# Patient Record
Sex: Male | Born: 1960 | Race: White | Hispanic: No | Marital: Married | Smoking: Current some day smoker
Health system: Southern US, Community
[De-identification: ages and names within clinical notes are randomized; demographics above are authoritative.]

## PROBLEM LIST (undated history)

## (undated) DIAGNOSIS — R21 Rash and other nonspecific skin eruption: Secondary | ICD-10-CM

## (undated) DIAGNOSIS — G5620 Lesion of ulnar nerve, unspecified upper limb: Secondary | ICD-10-CM

## (undated) DIAGNOSIS — E119 Type 2 diabetes mellitus without complications: Secondary | ICD-10-CM

## (undated) DIAGNOSIS — I10 Essential (primary) hypertension: Secondary | ICD-10-CM

## (undated) DIAGNOSIS — F909 Attention-deficit hyperactivity disorder, unspecified type: Secondary | ICD-10-CM

## (undated) DIAGNOSIS — G4700 Insomnia, unspecified: Secondary | ICD-10-CM

## (undated) DIAGNOSIS — D649 Anemia, unspecified: Secondary | ICD-10-CM

## (undated) DIAGNOSIS — F1011 Alcohol abuse, in remission: Secondary | ICD-10-CM

## (undated) DIAGNOSIS — G9349 Other encephalopathy: Secondary | ICD-10-CM

## (undated) DIAGNOSIS — B999 Unspecified infectious disease: Secondary | ICD-10-CM

## (undated) DIAGNOSIS — G009 Bacterial meningitis, unspecified: Secondary | ICD-10-CM

## (undated) DIAGNOSIS — F319 Bipolar disorder, unspecified: Secondary | ICD-10-CM

## (undated) HISTORY — PX: CYST EXCISION: SHX5701

---

## 2018-09-27 DIAGNOSIS — G9349 Other encephalopathy: Secondary | ICD-10-CM

## 2018-09-27 DIAGNOSIS — B999 Unspecified infectious disease: Secondary | ICD-10-CM

## 2018-09-27 HISTORY — DX: Unspecified infectious disease: B99.9

## 2018-09-27 HISTORY — DX: Other encephalopathy: G93.49

## 2018-10-11 HISTORY — PX: CERVICAL LAMINECTOMY: SHX94

## 2018-10-22 ENCOUNTER — Encounter: Payer: Self-pay | Admitting: Occupational Therapy

## 2018-10-22 NOTE — PMR Pre-admission (Signed)
[facility] PMR Admission Coordinator Pre-Admission Assessment  Patient: Jonathan Horne is an 57 y.o., male MRN: [id number] DOB: 1961-03-08 Height: 5' 1.32" (155.8 cm) Weight: 72.1 kg  Insurance Information HMO:     PPO:      PCP:      IPA:      80/20:      OTHER:  PRIMARY: BCBS      Policy#: [id number]      Subscriber: Patient CM Name: [name]      Phone#: [phone]     Fax#: [phone] Auth for CIR provided by Larene Beach at [facility] on 11/26 with auth effective 11/26-12/10. Per Larene Beach, pt can admit 11/27 with clinical updates due do LaDester on 12/9.  Pre-Cert#: [id number]      Employer:  Benefits:  Phone #: [phone]     Name: confirmed with supervisor (ref # for call: [id number]) Eff. Date: 11/27/17     Deduct: $250 (met $157.02)      Out of Pocket Max: $600 (Met $600)      Life Max: NA CIR: 70%/30%      SNF: 70%/30%, prior review required; 60 day limit Outpatient:      Co-Pay: $20/visit in office; visit limit is 30 Home Health: 70%      Co-Pay: 30% DME: 70%     Co-Pay: 30% Providers:  SECONDARY:       Policy#:       Subscriber:  CM Name:       Phone#:      Fax#:  Pre-Cert#:       Employer:  Benefits:  Phone #:      Name:  Eff. 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 Pt was sent home with IV antibiotics and  at supervision  level without AD use. Pt returned approximately 10 days later to [facility] for headaches, hallucinations, LUE weakness and LLE proprioceptive and sensory deficits as well as severe neck pain. 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 Pt underwent the following procedure: laminectomy with exploration and/or decompression of spinal cord and/or cauda equina without facetectomy, foraminotomy or discectomy, more than 2 vertebral segments; cervical ---C1, C2 and C3 decompressive laminectomies. Pt has been placed in halo brace. Pt has progressed well with therapies with both PT and OT recommending CIR. Pt is to be admitted to CIR on 10/23/18.  Patient's medical record from [facility] has been reviewed by the rehabilitation admission coordinator and physician.     Past  Medical History  No past medical history on file.  Family History   family history is not on file.  Prior Rehab/Hospitalizations Has the patient had major surgery during 100 days prior to admission? Yes    Current Medications No current outpatient medications on file.  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 Self Care: Did the patient need help bathing, dressing, using the toilet or eating?  Independent  Indoor Mobility: Did the patient  need assistance with walking from room to room (with or without device)? Independent  Stairs: Did the patient need assistance with internal or external stairs (with or without device)? Independent  Functional Cognition: Did the patient need help planning regular tasks such as shopping or remembering to take medications? Independent  Home Assistive Devices / Equipment Home Assistive Devices/Equipment: None  Prior Device Use: Indicate devices/aids used by the patient prior to current illness, exacerbation or injury? None of the above  Prior Functional Level     Prior Functional Level Current Functional Level  Bed Mobility  Independent  Min A  Transfers  Independent  Mod A x2  Mobility - Walk/Wheelchair  Independent Mod Ax2  Mobility - Ambulation/Gait  Independent  Mod A x2  Upper Body Dressing  Independent  Mod A  Lower Body Dressing  Independent  Max A  Grooming  Independent  Min A   Eating/Drinking  Independent  Supervision following set up  Toilet Transfer  Independent  Mod A x2  Bladder Continence  Continent  requiring In and Out cath, dependent  Bowel Management   Continent  incontinent, dependent  Stair Climbing  Independent  NT  Communication  WNL  no deficits noted  Memory  WNL  no deficits noted  Cooking/Meal Prep  Independent      Housework  Independent   Money Management  Independent   Driving  Independent     Special needs/care consideration BiPAP/CPAP: no CPM: no Continuous Drip IV: no Dialysis: no        Days: no Life Vest: no Oxygen: no Special Bed: no Trach Size: no Wound Vac (area): no      Location: no Skin: Halo with pins, stage 1 ulcer/sheering injury to sacrum      Bowel mgmt: functional incontinence, last BM: 10/22/18  Bladder mgmt: urinary retention, will need In and Out Cath Diabetic mgmt: yes  Previous Home Environment Living Arrangements: Spouse/significant other, Children  Lives With: Spouse, Family, Son Available Help at Discharge:  Family, Available 24 hours/day(assist splint between son and wife during the day) Type of Home: House Home Layout: Multi-level(split level) Alternate Level Stairs-Rails: Can reach both Alternate Level Stairs-Number of Steps: 7 Home Access: Stairs to enter Entrance Stairs-Rails: None Entrance Stairs-Number of Steps: 2 Bathroom Shower/Tub: Chiropodist: Standard Bathroom Accessibility: Yes How Accessible: Accessible via walker Home Care Services: No Additional Comments: split level home, will need to [facility] up steps to get to bathroom/bedroom  Discharge Living Setting Plans for Discharge Living Setting:  Patient's home, Lives with (comment) Type of Home at Discharge: House(split level) Discharge Home Layout: Multi-level Alternate Level Stairs-Rails: Can reach both Alternate Level Stairs-Number of Steps: 7 Discharge Home Access: Stairs to enter Entrance Stairs-Rails: None Entrance Stairs-Number of Steps: 2 Discharge Bathroom Shower/Tub: Tub/shower unit Discharge Bathroom Toilet: Standard Discharge Bathroom Accessibility: Yes How Accessible: Accessible via walker Does the patient have any problems obtaining your medications?: No  Social/Family/Support Systems Patient Roles: Spouse, Parent Contact Information: Jung Yurchak: [phone]; [phone] Anticipated Caregiver: Spouse and Son will alternate shifts during the day Anticipated Caregiver's Contact Information: see above Ability/Limitations of Caregiver: Min; wife was Production assistant, radio Availability: 24/7 Discharge Plan Discussed with Primary Caregiver: Yes(with spouse and pt) Is Caregiver In Agreement with Plan?: Yes Does Caregiver/Family have Issues with Lodging/Transportation while Pt is in Rehab?: No  Goals/Additional Needs Patient/Family Goal for Rehab: PT/OT: Supervision to Min A; SLP: NA Expected length of stay: 10-14 days Cultural Considerations: NA Dietary Needs: carb modified; thin  liquids Equipment Needs: TBD Pt/Family Agrees to Admission and willing to participate: Yes Program Orientation Provided & Reviewed with Pt/Caregiver Including Roles  & Responsibilities: Yes  Barriers to Discharge: Inaccessible home environment, Home environment access/layout, IV antibiotics, Incontinence, Neurogenic Bowel & Bladder, Insurance for SNF coverage  Barriers to Discharge Comments: HALO, will need to manage stairs to enter home;   Patient Condition: I have reviewed medical records from [facility], spoken with CM, RN, and both the patient and his spouse. I discussed via phone the expectations for CIR and gathered all information needed for inpatient rehabilitation assessment.  Patient will benefit from ongoing PT and OT, can actively participate in 3 hours of therapy a day 5 days of the week, and can make measurable gains during the admission.  Patient will also benefit from the coordinated team approach during an Inpatient Acute Rehabilitation admission.  The patient will receive intensive therapy as well as Rehabilitation physician, nursing, social worker, and care management interventions.  Due to bowel management, bladder management, safety, skin/wound care, disease management, medical administration, pain management, patient education the patient requires 24 hour a day rehabilitation nursing.  The patient is currently Mod A x2 with mobility and Mod to Max A for basic ADLs.  Discharge setting and therapy post discharge at home with home health is anticipated.  Patient has agreed to participate in the Acute Inpatient Rehabilitation Program and will admit today, 10/23/18.  Preadmission Screen Completed By:  Jamse Arn, 10/23/2018 11:30 AM ______________________________________________________________________   Discussed status with Dr. Posey Pronto  on 10/23/18 at 9:00AM and received telephone approval for admission today.  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 Does the need for close, 24 hr/day  Medical supervision in concert with the patient's rehab needs make it unreasonable for this patient to be served in a less intensive setting? Yes  2. Co-Morbidities requiring supervision/potential complications: bipolar disorder, DM2, and recent admission in October for bacterial meningitis 3. Due to bladder management, safety, skin/wound care, disease management, pain management and patient education, does the patient require 24 hr/day rehab nursing? Yes 4. Does the patient require coordinated care of a physician, rehab nurse, PT (1-2 hrs/day, 5 days/week) and OT (1-2 hrs/day, 5 days/week) to address physical and functional deficits in the context of the above medical diagnosis(es)? Yes Addressing deficits in the following areas: balance, endurance, locomotion, strength, transferring, bowel/bladder control,  bathing, dressing, toileting and psychosocial support 5. Can the patient actively participate in an intensive therapy program of at least 3 hrs of therapy 5 days a week? Yes 6. The potential for patient to make measurable gains while on inpatient rehab is excellent 7. Anticipated functional outcomes upon discharge from inpatients are: min assist PT, min assist OT, n/a SLP 8. Estimated rehab length of stay to reach the above functional goals is: 16-19 days. 9. Anticipated D/C setting: Home 10. Anticipated post D/C treatments: HH therapy and Home excercise program 11. Overall Rehab/Functional Prognosis: good    RECOMMENDATIONS: This patient's condition is appropriate for continued rehabilitative care in the following setting: CIR Patient has agreed to participate in recommended program. Yes Note that insurance prior authorization may be required for reimbursement for recommended care.  Comment:  Jhonnie Garner, OT 10/23/2018 Ankit Lorie Phenix 10/23/2018

## 2018-10-23 ENCOUNTER — Inpatient Hospital Stay (HOSPITAL_COMMUNITY)
Admission: RE | Admit: 2018-10-23 | Discharge: 2018-11-06 | DRG: 559 | Disposition: A | Discharge status: Home Health | Payer: BLUE CROSS/BLUE SHIELD | Source: Intra-hospital | Attending: Physical Medicine & Rehabilitation | Admitting: Physical Medicine & Rehabilitation

## 2018-10-23 ENCOUNTER — Encounter (HOSPITAL_COMMUNITY): Payer: Self-pay | Admitting: Physical Medicine and Rehabilitation

## 2018-10-23 ENCOUNTER — Other Ambulatory Visit: Payer: Self-pay

## 2018-10-23 ENCOUNTER — Other Ambulatory Visit (HOSPITAL_COMMUNITY): Payer: Self-pay | Admitting: Physical Medicine and Rehabilitation

## 2018-10-23 ENCOUNTER — Inpatient Hospital Stay (HOSPITAL_COMMUNITY): Payer: BLUE CROSS/BLUE SHIELD

## 2018-10-23 DIAGNOSIS — Z888 Allergy status to other drugs, medicaments and biological substances status: Secondary | ICD-10-CM | POA: Diagnosis not present

## 2018-10-23 DIAGNOSIS — K5900 Constipation, unspecified: Secondary | ICD-10-CM

## 2018-10-23 DIAGNOSIS — G92 Toxic encephalopathy: Secondary | ICD-10-CM | POA: Diagnosis not present

## 2018-10-23 DIAGNOSIS — G9529 Other cord compression: Secondary | ICD-10-CM | POA: Diagnosis present

## 2018-10-23 DIAGNOSIS — Z794 Long term (current) use of insulin: Secondary | ICD-10-CM | POA: Diagnosis not present

## 2018-10-23 DIAGNOSIS — E8809 Other disorders of plasma-protein metabolism, not elsewhere classified: Secondary | ICD-10-CM | POA: Diagnosis present

## 2018-10-23 DIAGNOSIS — D649 Anemia, unspecified: Secondary | ICD-10-CM | POA: Diagnosis present

## 2018-10-23 DIAGNOSIS — G062 Extradural and subdural abscess, unspecified: Secondary | ICD-10-CM

## 2018-10-23 DIAGNOSIS — G4700 Insomnia, unspecified: Secondary | ICD-10-CM | POA: Diagnosis present

## 2018-10-23 DIAGNOSIS — K5903 Drug induced constipation: Secondary | ICD-10-CM | POA: Diagnosis not present

## 2018-10-23 DIAGNOSIS — G959 Disease of spinal cord, unspecified: Secondary | ICD-10-CM | POA: Diagnosis present

## 2018-10-23 DIAGNOSIS — Z885 Allergy status to narcotic agent status: Secondary | ICD-10-CM | POA: Diagnosis not present

## 2018-10-23 DIAGNOSIS — D62 Acute posthemorrhagic anemia: Secondary | ICD-10-CM | POA: Diagnosis present

## 2018-10-23 DIAGNOSIS — K5901 Slow transit constipation: Secondary | ICD-10-CM | POA: Diagnosis not present

## 2018-10-23 DIAGNOSIS — I10 Essential (primary) hypertension: Secondary | ICD-10-CM | POA: Diagnosis present

## 2018-10-23 DIAGNOSIS — Z8249 Family history of ischemic heart disease and other diseases of the circulatory system: Secondary | ICD-10-CM | POA: Diagnosis not present

## 2018-10-23 DIAGNOSIS — R1313 Dysphagia, pharyngeal phase: Secondary | ICD-10-CM | POA: Diagnosis present

## 2018-10-23 DIAGNOSIS — Z452 Encounter for adjustment and management of vascular access device: Secondary | ICD-10-CM

## 2018-10-23 DIAGNOSIS — F319 Bipolar disorder, unspecified: Secondary | ICD-10-CM | POA: Diagnosis present

## 2018-10-23 DIAGNOSIS — F3170 Bipolar disorder, currently in remission, most recent episode unspecified: Secondary | ICD-10-CM | POA: Diagnosis not present

## 2018-10-23 DIAGNOSIS — N319 Neuromuscular dysfunction of bladder, unspecified: Secondary | ICD-10-CM

## 2018-10-23 DIAGNOSIS — Z833 Family history of diabetes mellitus: Secondary | ICD-10-CM

## 2018-10-23 DIAGNOSIS — G9340 Encephalopathy, unspecified: Secondary | ICD-10-CM

## 2018-10-23 DIAGNOSIS — E119 Type 2 diabetes mellitus without complications: Secondary | ICD-10-CM

## 2018-10-23 DIAGNOSIS — Z87891 Personal history of nicotine dependence: Secondary | ICD-10-CM

## 2018-10-23 DIAGNOSIS — G5620 Lesion of ulnar nerve, unspecified upper limb: Secondary | ICD-10-CM | POA: Diagnosis present

## 2018-10-23 DIAGNOSIS — T40605A Adverse effect of unspecified narcotics, initial encounter: Secondary | ICD-10-CM | POA: Diagnosis not present

## 2018-10-23 DIAGNOSIS — Z4789 Encounter for other orthopedic aftercare: Secondary | ICD-10-CM | POA: Diagnosis present

## 2018-10-23 DIAGNOSIS — K592 Neurogenic bowel, not elsewhere classified: Secondary | ICD-10-CM | POA: Diagnosis present

## 2018-10-23 DIAGNOSIS — R338 Other retention of urine: Secondary | ICD-10-CM | POA: Diagnosis present

## 2018-10-23 DIAGNOSIS — Z811 Family history of alcohol abuse and dependence: Secondary | ICD-10-CM

## 2018-10-23 DIAGNOSIS — F909 Attention-deficit hyperactivity disorder, unspecified type: Secondary | ICD-10-CM | POA: Diagnosis present

## 2018-10-23 DIAGNOSIS — F419 Anxiety disorder, unspecified: Secondary | ICD-10-CM | POA: Diagnosis present

## 2018-10-23 DIAGNOSIS — I959 Hypotension, unspecified: Secondary | ICD-10-CM | POA: Diagnosis not present

## 2018-10-23 DIAGNOSIS — R609 Edema, unspecified: Secondary | ICD-10-CM | POA: Diagnosis not present

## 2018-10-23 HISTORY — DX: Alcohol abuse, in remission: F10.11

## 2018-10-23 HISTORY — DX: Rash and other nonspecific skin eruption: R21

## 2018-10-23 HISTORY — DX: Insomnia, unspecified: G47.00

## 2018-10-23 HISTORY — DX: Other encephalopathy: G93.49

## 2018-10-23 HISTORY — DX: Bacterial meningitis, unspecified: G00.9

## 2018-10-23 HISTORY — DX: Attention-deficit hyperactivity disorder, unspecified type: F90.9

## 2018-10-23 HISTORY — DX: Lesion of ulnar nerve, unspecified upper limb: G56.20

## 2018-10-23 HISTORY — DX: Essential (primary) hypertension: I10

## 2018-10-23 HISTORY — DX: Anemia, unspecified: D64.9

## 2018-10-23 HISTORY — DX: Type 2 diabetes mellitus without complications: E11.9

## 2018-10-23 HISTORY — DX: Bipolar disorder, unspecified: F31.9

## 2018-10-23 HISTORY — DX: Unspecified infectious disease: B99.9

## 2018-10-23 LAB — GLUCOSE, CAPILLARY
GLUCOSE-CAPILLARY: 164 mg/dL — AB (ref 70–99)
Glucose-Capillary: 122 mg/dL — ABNORMAL HIGH (ref 70–99)

## 2018-10-23 MED ORDER — SODIUM CHLORIDE 0.9% FLUSH
10.0000 mL | Freq: Two times a day (BID) | INTRAVENOUS | Status: DC
  Administered 2018-10-24 – 2018-11-02 (×12): 10 mL
  Administered 2018-11-05: 40 mL
  Administered 2018-11-05: 10 mL
  Administered 2018-11-05: 40 mL

## 2018-10-23 MED ORDER — DIPHENHYDRAMINE HCL 12.5 MG/5ML PO ELIX: 12.5000 mg | ORAL_SOLUTION | Freq: Four times a day (QID) | ORAL | Status: DC | PRN

## 2018-10-23 MED ORDER — ALUM & MAG HYDROXIDE-SIMETH 200-200-20 MG/5ML PO SUSP: 30.0000 mL | ORAL | Status: DC | PRN

## 2018-10-23 MED ORDER — TRAMADOL HCL 50 MG PO TABS
50.0000 mg | ORAL_TABLET | Freq: Four times a day (QID) | ORAL | Status: DC | PRN
  Filled 2018-10-23: qty 1

## 2018-10-23 MED ORDER — PROCHLORPERAZINE EDISYLATE 10 MG/2ML IJ SOLN: 5.0000 mg | Freq: Four times a day (QID) | INTRAMUSCULAR | Status: DC | PRN

## 2018-10-23 MED ORDER — HYDROCORTISONE 2.5 % RE CREA
TOPICAL_CREAM | RECTAL | Status: DC
Start: 2018-10-23 — End: 2018-10-23

## 2018-10-23 MED ORDER — BISACODYL 10 MG RE SUPP
10.0000 mg | Freq: Every day | RECTAL | Status: DC
  Administered 2018-10-24 – 2018-11-06 (×14): 10 mg via RECTAL
  Filled 2018-10-23 (×14): qty 1

## 2018-10-23 MED ORDER — INSULIN ASPART 100 UNIT/ML ~~LOC~~ SOLN
10.0000 [IU] | Freq: Two times a day (BID) | SUBCUTANEOUS | Status: DC
  Administered 2018-10-23 – 2018-11-05 (×25): 10 [IU] via SUBCUTANEOUS

## 2018-10-23 MED ORDER — SODIUM CHLORIDE 0.9% FLUSH
10.0000 mL | INTRAVENOUS | Status: DC | PRN
  Administered 2018-10-24 – 2018-10-25 (×2): 20 mL
  Administered 2018-10-25 – 2018-11-04 (×8): 10 mL
  Filled 2018-10-23 (×10): qty 40

## 2018-10-23 MED ORDER — LOSARTAN POTASSIUM 25 MG PO TABS
25.00 | ORAL_TABLET | ORAL | Status: DC
Start: 2018-10-24 — End: 2018-10-23

## 2018-10-23 MED ORDER — TAMSULOSIN HCL 0.4 MG PO CAPS
0.4000 mg | ORAL_CAPSULE | Freq: Every day | ORAL | Status: DC
  Administered 2018-10-24 – 2018-11-05 (×13): 0.4 mg via ORAL
  Filled 2018-10-23 (×13): qty 1

## 2018-10-23 MED ORDER — TAMSULOSIN HCL 0.4 MG PO CAPS
0.40 | ORAL_CAPSULE | ORAL | Status: DC
Start: 2018-10-24 — End: 2018-10-23

## 2018-10-23 MED ORDER — ARIPIPRAZOLE 5 MG PO TABS
15.0000 mg | ORAL_TABLET | Freq: Every day | ORAL | Status: DC
  Administered 2018-10-24 – 2018-11-06 (×14): 15 mg via ORAL
  Filled 2018-10-23 (×14): qty 1

## 2018-10-23 MED ORDER — SODIUM CHLORIDE FLUSH 0.9 % IV SOLN
10.00 | INTRAVENOUS | Status: DC
Start: ? — End: 2018-10-23

## 2018-10-23 MED ORDER — ACETAMINOPHEN 325 MG PO TABS
325.0000 mg | ORAL_TABLET | ORAL | Status: DC | PRN
  Administered 2018-10-23 – 2018-11-05 (×21): 650 mg via ORAL
  Administered 2018-11-05: 325 mg via ORAL
  Administered 2018-11-06: 650 mg via ORAL
  Filled 2018-10-23 (×24): qty 2

## 2018-10-23 MED ORDER — PROCHLORPERAZINE 25 MG RE SUPP: 12.5000 mg | Freq: Four times a day (QID) | RECTAL | Status: DC | PRN

## 2018-10-23 MED ORDER — PRO-STAT SUGAR FREE PO LIQD
30.0000 mL | Freq: Two times a day (BID) | ORAL | Status: DC
  Administered 2018-10-23 – 2018-10-28 (×9): 30 mL via ORAL
  Filled 2018-10-23 (×10): qty 30

## 2018-10-23 MED ORDER — GLUCAGON HCL RDNA (DIAGNOSTIC) 1 MG IJ SOLR
1.00 | INTRAMUSCULAR | Status: DC
Start: ? — End: 2018-10-23

## 2018-10-23 MED ORDER — GENERIC EXTERNAL MEDICATION
150.00 | Status: DC
Start: 2018-10-24 — End: 2018-10-23

## 2018-10-23 MED ORDER — TRAZODONE HCL 50 MG PO TABS
25.0000 mg | ORAL_TABLET | Freq: Every evening | ORAL | Status: DC | PRN
  Administered 2018-10-23 – 2018-10-29 (×3): 50 mg via ORAL
  Filled 2018-10-23 (×3): qty 1

## 2018-10-23 MED ORDER — ATORVASTATIN CALCIUM 80 MG PO TABS
80.0000 mg | ORAL_TABLET | Freq: Every day | ORAL | Status: DC
  Administered 2018-10-24 – 2018-11-05 (×13): 80 mg via ORAL
  Filled 2018-10-23 (×13): qty 1

## 2018-10-23 MED ORDER — HYDROXYZINE HCL 10 MG PO TABS
10.00 | ORAL_TABLET | ORAL | Status: DC
Start: ? — End: 2018-10-23

## 2018-10-23 MED ORDER — GENERIC EXTERNAL MEDICATION
4.00 | Status: DC
Start: ? — End: 2018-10-23

## 2018-10-23 MED ORDER — BACITRACIN-NEOMYCIN-POLYMYXIN OINTMENT TUBE
TOPICAL_OINTMENT | Freq: Two times a day (BID) | CUTANEOUS | Status: DC
  Administered 2018-10-23 – 2018-10-24 (×2): via TOPICAL
  Administered 2018-10-24: 1 via TOPICAL
  Administered 2018-10-25 – 2018-10-27 (×5): via TOPICAL
  Administered 2018-10-27: 1 via TOPICAL
  Administered 2018-10-28 – 2018-11-01 (×9): via TOPICAL
  Administered 2018-11-01 – 2018-11-02 (×2): 1 via TOPICAL
  Administered 2018-11-02 – 2018-11-06 (×8): via TOPICAL
  Filled 2018-10-23: qty 14

## 2018-10-23 MED ORDER — GUAIFENESIN-DM 100-10 MG/5ML PO SYRP: 5.0000 mL | ORAL_SOLUTION | Freq: Four times a day (QID) | ORAL | Status: DC | PRN

## 2018-10-23 MED ORDER — HYDROGEN PEROXIDE 3 % EX SOLN
Freq: Two times a day (BID) | CUTANEOUS | Status: DC
  Administered 2018-10-24 (×2): via TOPICAL
  Administered 2018-10-24: 1 via TOPICAL
  Administered 2018-10-25 – 2018-10-27 (×5): via TOPICAL
  Administered 2018-10-27: 1 via TOPICAL
  Administered 2018-10-28 – 2018-10-29 (×4): via TOPICAL
  Administered 2018-10-30: 1 via TOPICAL
  Administered 2018-10-30 – 2018-11-03 (×8): via TOPICAL
  Administered 2018-11-03: 1 via TOPICAL
  Administered 2018-11-04 – 2018-11-06 (×5): via TOPICAL
  Filled 2018-10-23 (×3): qty 473

## 2018-10-23 MED ORDER — ALPRAZOLAM 0.5 MG PO TABS
0.5000 mg | ORAL_TABLET | Freq: Every evening | ORAL | Status: DC | PRN
  Administered 2018-10-26 – 2018-11-05 (×11): 0.5 mg via ORAL
  Filled 2018-10-23 (×12): qty 1

## 2018-10-23 MED ORDER — GENERIC EXTERNAL MEDICATION
2.00 | Status: DC
Start: 2018-10-23 — End: 2018-10-23

## 2018-10-23 MED ORDER — ATORVASTATIN CALCIUM 80 MG PO TABS: 80.0000 mg | ORAL_TABLET | Freq: Every day | ORAL | Status: DC

## 2018-10-23 MED ORDER — FINASTERIDE 5 MG PO TABS
5.0000 mg | ORAL_TABLET | Freq: Every day | ORAL | Status: DC
  Administered 2018-10-24 – 2018-11-06 (×14): 5 mg via ORAL
  Filled 2018-10-23 (×14): qty 1

## 2018-10-23 MED ORDER — ACETAMINOPHEN 325 MG PO TABS
650.00 | ORAL_TABLET | ORAL | Status: DC
Start: ? — End: 2018-10-23

## 2018-10-23 MED ORDER — DOCUSATE SODIUM 100 MG PO CAPS
100.00 | ORAL_CAPSULE | ORAL | Status: DC
Start: 2018-10-24 — End: 2018-10-23

## 2018-10-23 MED ORDER — SERTRALINE HCL 100 MG PO TABS
100.0000 mg | ORAL_TABLET | Freq: Every day | ORAL | Status: DC
  Administered 2018-10-24 – 2018-11-06 (×14): 100 mg via ORAL
  Filled 2018-10-23 (×14): qty 1

## 2018-10-23 MED ORDER — HYDROCERIN EX CREA
TOPICAL_CREAM | Freq: Two times a day (BID) | CUTANEOUS | Status: DC
  Administered 2018-10-23 – 2018-10-26 (×6): via TOPICAL
  Administered 2018-10-27: 1 via TOPICAL
  Administered 2018-10-28 – 2018-11-01 (×8): via TOPICAL
  Administered 2018-11-01: 1 via TOPICAL
  Administered 2018-11-02: 09:00:00 via TOPICAL
  Administered 2018-11-02: 1 via TOPICAL
  Administered 2018-11-03: 08:00:00 via TOPICAL
  Administered 2018-11-03: 1 via TOPICAL
  Administered 2018-11-04 – 2018-11-06 (×5): via TOPICAL
  Filled 2018-10-23: qty 113

## 2018-10-23 MED ORDER — SODIUM CHLORIDE FLUSH 0.9 % IV SOLN
10.00 | INTRAVENOUS | Status: DC
Start: 2018-10-23 — End: 2018-10-23

## 2018-10-23 MED ORDER — PROCHLORPERAZINE MALEATE 5 MG PO TABS: 5.0000 mg | ORAL_TABLET | Freq: Four times a day (QID) | ORAL | Status: DC | PRN

## 2018-10-23 MED ORDER — GLUCERNA SHAKE PO LIQD
237.0000 mL | Freq: Three times a day (TID) | ORAL | Status: DC
  Administered 2018-10-23 – 2018-10-30 (×18): 237 mL via ORAL

## 2018-10-23 MED ORDER — MELATONIN 3 MG PO TABS
3.00 | ORAL_TABLET | ORAL | Status: DC
Start: 2018-10-23 — End: 2018-10-23

## 2018-10-23 MED ORDER — LIDOCAINE HCL URETHRAL/MUCOSAL 2 % EX GEL
CUTANEOUS | Status: DC | PRN
  Filled 2018-10-23 (×3): qty 5

## 2018-10-23 MED ORDER — LOSARTAN POTASSIUM 50 MG PO TABS
25.0000 mg | ORAL_TABLET | Freq: Every day | ORAL | Status: DC
  Administered 2018-10-24 – 2018-10-27 (×4): 25 mg via ORAL
  Filled 2018-10-23 (×4): qty 1

## 2018-10-23 MED ORDER — INSULIN GLARGINE 100 UNIT/ML ~~LOC~~ SOLN
28.00 | SUBCUTANEOUS | Status: DC
Start: 2018-10-23 — End: 2018-10-23

## 2018-10-23 MED ORDER — HYDROXYZINE HCL 10 MG PO TABS
10.0000 mg | ORAL_TABLET | Freq: Four times a day (QID) | ORAL | Status: DC | PRN
  Administered 2018-10-25 – 2018-10-30 (×2): 10 mg via ORAL
  Filled 2018-10-23 (×3): qty 1

## 2018-10-23 MED ORDER — SIMETHICONE 80 MG PO CHEW
80.00 | CHEWABLE_TABLET | ORAL | Status: DC
Start: ? — End: 2018-10-23

## 2018-10-23 MED ORDER — DERMACERIN EX CREA
TOPICAL_CREAM | CUTANEOUS | Status: DC
Start: ? — End: 2018-10-23

## 2018-10-23 MED ORDER — INSULIN ASPART 100 UNIT/ML ~~LOC~~ SOLN
0.0000 [IU] | Freq: Every day | SUBCUTANEOUS | Status: DC
  Administered 2018-11-02: 2 [IU] via SUBCUTANEOUS

## 2018-10-23 MED ORDER — INSULIN ASPART 100 UNIT/ML ~~LOC~~ SOLN
8.0000 [IU] | Freq: Every day | SUBCUTANEOUS | Status: DC
  Administered 2018-10-24 – 2018-11-05 (×13): 8 [IU] via SUBCUTANEOUS

## 2018-10-23 MED ORDER — ALPRAZOLAM 0.5 MG PO TABS
0.50 | ORAL_TABLET | ORAL | Status: DC
Start: ? — End: 2018-10-23

## 2018-10-23 MED ORDER — FINASTERIDE 5 MG PO TABS
5.00 | ORAL_TABLET | ORAL | Status: DC
Start: 2018-10-24 — End: 2018-10-23

## 2018-10-23 MED ORDER — SENNOSIDES-DOCUSATE SODIUM 8.6-50 MG PO TABS: 2.0000 | ORAL_TABLET | Freq: Every evening | ORAL | Status: DC | PRN

## 2018-10-23 MED ORDER — ENOXAPARIN SODIUM 40 MG/0.4ML ~~LOC~~ SOLN
40.0000 mg | Freq: Every day | SUBCUTANEOUS | Status: DC
  Administered 2018-10-24 – 2018-11-06 (×14): 40 mg via SUBCUTANEOUS
  Filled 2018-10-23 (×14): qty 0.4

## 2018-10-23 MED ORDER — INSULIN LISPRO 100 UNIT/ML ~~LOC~~ SOLN
0.00 | SUBCUTANEOUS | Status: DC
Start: 2018-10-23 — End: 2018-10-23

## 2018-10-23 MED ORDER — INSULIN LISPRO 100 UNIT/ML ~~LOC~~ SOLN
8.00 | SUBCUTANEOUS | Status: DC
Start: 2018-10-24 — End: 2018-10-23

## 2018-10-23 MED ORDER — ASPIRIN EC 81 MG PO TBEC
81.00 | DELAYED_RELEASE_TABLET | ORAL | Status: DC
Start: 2018-10-24 — End: 2018-10-23

## 2018-10-23 MED ORDER — INSULIN GLARGINE 100 UNIT/ML ~~LOC~~ SOLN
28.0000 [IU] | Freq: Every day | SUBCUTANEOUS | Status: DC
  Administered 2018-10-23 – 2018-11-05 (×14): 28 [IU] via SUBCUTANEOUS
  Filled 2018-10-23 (×19): qty 0.28

## 2018-10-23 MED ORDER — HYDROCORTISONE ACETATE 25 MG RE SUPP
25.0000 mg | Freq: Two times a day (BID) | RECTAL | Status: DC
  Administered 2018-10-23 – 2018-11-01 (×6): 25 mg via RECTAL
  Filled 2018-10-23 (×20): qty 1

## 2018-10-23 MED ORDER — ATORVASTATIN CALCIUM 40 MG PO TABS
80.00 | ORAL_TABLET | ORAL | Status: DC
Start: 2018-10-24 — End: 2018-10-23

## 2018-10-23 MED ORDER — SODIUM CHLORIDE 0.9 % IV SOLN
2.0000 g | Freq: Two times a day (BID) | INTRAVENOUS | Status: DC
  Administered 2018-10-23 – 2018-11-06 (×29): 2 g via INTRAVENOUS
  Filled 2018-10-23 (×31): qty 20

## 2018-10-23 MED ORDER — INSULIN LISPRO 100 UNIT/ML ~~LOC~~ SOLN
10.00 | SUBCUTANEOUS | Status: DC
Start: 2018-10-23 — End: 2018-10-23

## 2018-10-23 MED ORDER — DEXTROSE 50 % IV SOLN
12.50 | INTRAVENOUS | Status: DC
Start: ? — End: 2018-10-23

## 2018-10-23 MED ORDER — TAMSULOSIN HCL 0.4 MG PO CAPS: 0.4000 mg | ORAL_CAPSULE | Freq: Every day | ORAL | Status: DC

## 2018-10-23 MED ORDER — NYSTATIN 100000 UNIT/GM EX CREA
TOPICAL_CREAM | Freq: Two times a day (BID) | CUTANEOUS | Status: DC
  Administered 2018-10-23 – 2018-10-30 (×12): via TOPICAL
  Administered 2018-10-30: 1 via TOPICAL
  Administered 2018-10-31 – 2018-11-01 (×3): via TOPICAL
  Administered 2018-11-01: 1 via TOPICAL
  Administered 2018-11-02: 09:00:00 via TOPICAL
  Administered 2018-11-02 – 2018-11-03 (×2): 1 via TOPICAL
  Administered 2018-11-03 – 2018-11-06 (×5): via TOPICAL
  Filled 2018-10-23 (×3): qty 15

## 2018-10-23 MED ORDER — FLEET ENEMA 7-19 GM/118ML RE ENEM: 1.0000 | ENEMA | Freq: Once | RECTAL | Status: DC | PRN

## 2018-10-23 MED ORDER — BACITRACIN-POLYMYXIN B 500-10000 UNIT/GM EX OINT
TOPICAL_OINTMENT | CUTANEOUS | Status: DC
Start: 2018-10-23 — End: 2018-10-23

## 2018-10-23 MED ORDER — HEPARIN SODIUM (PORCINE) 10000 UNIT/ML IJ SOLN
5000.00 | INTRAMUSCULAR | Status: DC
Start: 2018-10-23 — End: 2018-10-23

## 2018-10-23 MED ORDER — INSULIN ASPART 100 UNIT/ML ~~LOC~~ SOLN
0.0000 [IU] | Freq: Three times a day (TID) | SUBCUTANEOUS | Status: DC
  Administered 2018-10-23 – 2018-10-24 (×2): 3 [IU] via SUBCUTANEOUS
  Administered 2018-10-24: 2 [IU] via SUBCUTANEOUS
  Administered 2018-10-24: 8 [IU] via SUBCUTANEOUS
  Administered 2018-10-25 – 2018-10-26 (×6): 3 [IU] via SUBCUTANEOUS
  Administered 2018-10-27: 2 [IU] via SUBCUTANEOUS
  Administered 2018-10-27 (×2): 3 [IU] via SUBCUTANEOUS
  Administered 2018-10-28 – 2018-10-30 (×6): 2 [IU] via SUBCUTANEOUS
  Administered 2018-10-31 – 2018-11-01 (×3): 3 [IU] via SUBCUTANEOUS
  Administered 2018-11-01: 5 [IU] via SUBCUTANEOUS
  Administered 2018-11-02: 3 [IU] via SUBCUTANEOUS
  Administered 2018-11-03: 2 [IU] via SUBCUTANEOUS
  Administered 2018-11-03 – 2018-11-04 (×3): 3 [IU] via SUBCUTANEOUS
  Administered 2018-11-04 – 2018-11-05 (×3): 2 [IU] via SUBCUTANEOUS
  Administered 2018-11-05: 3 [IU] via SUBCUTANEOUS

## 2018-10-23 MED ORDER — ARIPIPRAZOLE 15 MG PO TABS
15.00 | ORAL_TABLET | ORAL | Status: DC
Start: 2018-10-24 — End: 2018-10-23

## 2018-10-23 NOTE — Progress Notes (Signed)
[facility] PMR Admission Coordinator Pre-Admission Assessment  Patient: Jonathan Horne is an 57 y.o., male MRN: [id number] DOB: February 24, 1961 Height: 5' 1.32" (155.8 cm) Weight: 72.1 kg  Insurance Information HMO:     PPO:      PCP:      IPA:      80/20:      OTHER:  PRIMARY: BCBS      Policy#: [id number]      Subscriber: Patient CM Name: [name]      Phone#: [phone]     Fax#: [phone] Auth for CIR provided by Larene Beach at [facility] on 11/26 with auth effective 11/26-12/10. Per Larene Beach, pt can admit 11/27 with clinical updates due do LaDester on 12/9.  Pre-Cert#: [id number]      Employer:  Benefits:  Phone #: [phone]     Name: confirmed with supervisor (ref # for call: [id number]) Eff. Date: 11/27/17     Deduct: $250 (met $157.02)      Out of Pocket Max: $600 (Met $600)      Life Max: NA CIR: 70%/30%      SNF: 70%/30%, prior review required; 60 day limit Outpatient:      Co-Pay: $20/visit in office; visit limit is 30 Home Health: 70%      Co-Pay: 30% DME: 70%     Co-Pay: 30% Providers:  SECONDARY:       Policy#:       Subscriber:  CM Name:       Phone#:      Fax#:  Pre-Cert#:       Employer:  Benefits:  Phone #:      Name:  Eff. 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 Pt was sent home with IV  antibiotics and  at supervision level without AD use. Pt returned approximately 10 days later to [facility] for headaches, hallucinations, LUE weakness and LLE proprioceptive and sensory deficits as well as severe neck pain. 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 Pt underwent the following procedure: laminectomy with exploration and/or decompression of spinal cord and/or cauda equina without facetectomy, foraminotomy or discectomy, more than 2 vertebral segments; cervical ---C1, C2 and C3 decompressive laminectomies. Pt has been placed in halo brace. Pt has progressed well with therapies with both PT and OT recommending CIR. Pt is to be admitted to CIR on 10/23/18.  Patient's medical record from [facility] has been reviewed by the rehabilitation admission  coordinator and physician.   Past Medical History  No past medical history on file.  Family History   family history is not on file.  Prior Rehab/Hospitalizations Has the patient had major surgery during 100 days prior to admission? Yes              Current Medications No current outpatient medications on file.  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 Self Care: Did the patient need help bathing, dressing, using the toilet or  eating?  Independent  Indoor Mobility: Did the patient need assistance with walking from room to room (with or without device)? Independent  Stairs: Did the patient need assistance with internal or external stairs (with or without device)? Independent  Functional Cognition: Did the patient need help planning regular tasks such as shopping or remembering to take medications? Independent  Home Assistive Devices / Equipment Home Assistive Devices/Equipment: None  Prior Device Use: Indicate devices/aids used by the patient prior to current illness, exacerbation or injury? None of the above  Prior Functional Level   Prior Functional Level Current Functional Level  Bed Mobility  Independent  Min A  Transfers  Independent  Mod A x2  Mobility - Walk/Wheelchair  Independent Mod Ax2  Mobility - Ambulation/Gait  Independent  Mod A x2  Upper Body Dressing  Independent  Mod A  Lower Body Dressing  Independent  Max A  Grooming  Independent  Min A   Eating/Drinking  Independent  Supervision following set up  Toilet Transfer  Independent  Mod A x2  Bladder Continence  Continent  requiring In and Out cath, dependent  Bowel Management   Continent  incontinent, dependent  Stair Climbing  Independent  NT  Communication  WNL  no deficits noted  Memory  WNL  no deficits noted  Cooking/Meal Prep  Independent      Housework  Independent   Money Management  Independent   Driving  Independent     Special needs/care consideration BiPAP/CPAP: no CPM: no Continuous Drip IV: no Dialysis: no        Days: no Life Vest: no Oxygen: no Special Bed: no Trach Size: no Wound Vac (area): no      Location: no Skin: Halo with pins, stage 1 ulcer/sheering injury to sacrum      Bowel mgmt: functional incontinence, last BM: 10/22/18  Bladder mgmt: urinary retention, will need In and Out Cath Diabetic mgmt: yes  Previous Home Environment Living Arrangements: Spouse/significant  other, Children  Lives With: Spouse, Family, Son Available Help at Discharge: Family, Available 24 hours/day(assist splint between son and wife during the day) Type of Home: House Home Layout: Multi-level(split level) Alternate Level Stairs-Rails: Can reach both Alternate Level Stairs-Number of Steps: 7 Home Access: Stairs to enter Entrance Stairs-Rails: None Entrance Stairs-Number of Steps: 2 Bathroom Shower/Tub: Chiropodist: Standard Bathroom Accessibility: Yes How Accessible: Accessible via walker Home Care Services: No Additional Comments: split level home, will need  to [facility] up steps to get to bathroom/bedroom  Discharge Living Setting Plans for Discharge Living Setting: Patient's home, Lives with (comment) Type of Home at Discharge: House(split level) Discharge Home Layout: Multi-level Alternate Level Stairs-Rails: Can reach both Alternate Level Stairs-Number of Steps: 7 Discharge Home Access: Stairs to enter Entrance Stairs-Rails: None Entrance Stairs-Number of Steps: 2 Discharge Bathroom Shower/Tub: Tub/shower unit Discharge Bathroom Toilet: Standard Discharge Bathroom Accessibility: Yes How Accessible: Accessible via walker Does the patient have any problems obtaining your medications?: No  Social/Family/Support Systems Patient Roles: Spouse, Parent Contact Information: Tanner Yeley: [phone]; [phone] Anticipated Caregiver: Spouse and Son will alternate shifts during the day Anticipated Caregiver's Contact Information: see above Ability/Limitations of Caregiver: Min; wife was Production assistant, radio Availability: 24/7 Discharge Plan Discussed with Primary Caregiver: Yes(with spouse and pt) Is Caregiver In Agreement with Plan?: Yes Does Caregiver/Family have Issues with Lodging/Transportation while Pt is in Rehab?: No  Goals/Additional Needs Patient/Family Goal for Rehab: PT/OT: Supervision to Min A; SLP: NA Expected length of stay:  10-14 days Cultural Considerations: NA Dietary Needs: carb modified; thin liquids Equipment Needs: TBD Pt/Family Agrees to Admission and willing to participate: Yes Program Orientation Provided & Reviewed with Pt/Caregiver Including Roles  & Responsibilities: Yes  Barriers to Discharge: Inaccessible home environment, Home environment access/layout, IV antibiotics, Incontinence, Neurogenic Bowel & Bladder, Insurance for SNF coverage  Barriers to Discharge Comments: HALO, will need to manage stairs to enter home;   Patient Condition: I have reviewed medical records from [facility], spoken with CM, RN, and both the patient and his spouse. I discussed via phone the expectations for CIR and gathered all information needed for inpatient rehabilitation assessment.  Patient will benefit from ongoing PT and OT, can actively participate in 3 hours of therapy a day 5 days of the week, and can make measurable gains during the admission.  Patient will also benefit from the coordinated team approach during an Inpatient Acute Rehabilitation admission.  The patient will receive intensive therapy as well as Rehabilitation physician, nursing, social worker, and care management interventions.  Due to bowel management, bladder management, safety, skin/wound care, disease management, medical administration, pain management, patient education the patient requires 24 hour a day rehabilitation nursing.  The patient is currently Mod A x2 with mobility and Mod to Max A for basic ADLs.  Discharge setting and therapy post discharge at home with home health is anticipated.  Patient has agreed to participate in the Acute Inpatient Rehabilitation Program and will admit today, 10/23/18.  Preadmission Screen Completed By:  Jamse Arn, 10/23/2018 11:30 AM ______________________________________________________________________   Discussed status with Dr. Posey Pronto  on 10/23/18 at 9:00AM and received telephone approval for  admission today.  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 Does the need for close, 24 hr/day  Medical supervision in concert with the patient's rehab needs make it unreasonable for this patient to be served in a less intensive setting? Yes  2. Co-Morbidities requiring supervision/potential complications: bipolar disorder, DM2, and recent admission in October for bacterial meningitis 3. Due to bladder management, safety, skin/wound care, disease management, pain management and patient education, does the patient require 24 hr/day rehab nursing? Yes 4. Does the patient require coordinated care of a physician, rehab nurse, PT (1-2 hrs/day, 5 days/week) and OT (1-2 hrs/day, 5 days/week) to address physical and functional deficits in the context of the  above medical diagnosis(es)? Yes Addressing deficits in the following areas: balance, endurance, locomotion, strength, transferring, bowel/bladder control, bathing, dressing, toileting and psychosocial support 5. Can the patient actively participate in an intensive therapy program of at least 3 hrs of therapy 5 days a week? Yes 6. The potential for patient to make measurable gains while on inpatient rehab is excellent 7. Anticipated functional outcomes upon discharge from inpatients are: min assist PT, min assist OT, n/a SLP 8. Estimated rehab length of stay to reach the above functional goals is: 16-19 days. 9. Anticipated D/C setting: Home 10. Anticipated post D/C treatments: HH therapy and Home excercise program 11. Overall Rehab/Functional Prognosis: good    RECOMMENDATIONS: This patient's condition is appropriate for continued rehabilitative care in the following setting: CIR Patient has agreed to participate in recommended program. Yes Note that insurance prior  authorization may be required for reimbursement for recommended care.  Comment:  Jhonnie Garner, OT 10/23/2018 Ankit Lorie Phenix 10/23/2018        Cosigned by: Jamse Arn, MD at 10/23/2018 11:32 AM  Revision History

## 2018-10-23 NOTE — H&P (Signed)
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  History taken from chart review and patient.  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  He was discharged to home with 3 weeks course antibiotic but continued to have severe neck pain progressing LUE numbness and weakness as well as LLE numbness and to hallucinations.  He was admitted to [facility] on 10/07/2018 for work-up.  He was noted to be tachycardic and treated with fluid bolus and x-rays done revealing progression of epidural abscess with compression of upper cervical cord with increased signal at atlantal occipital articulation with suspicion of instability, soft tissue thickening can concerning for inflammation C1-C2 with phlegmon and concerns for developing abscess.  He was taken to the OR for C1-C3 laminectomy and decompression of spinal cord on 10/11/18 by Dr. Laury Deep. Halo placed for stabilization and to remain in place with pin care bid. Blood cultures X 2 11/14 were negative.  Hospital course significant for encephalopathy with hallucinations.  Tramadol and Adderall were discontinued.  Home dose Xanax decrease to bedtime due to daytime sedation.  He is also had issues with urinary retention requiring in and out caths as well as constipation. He is to continue IV ceftriaxone through 11/19/2018.  He has had improvement in LUE strength and coordination but continues to have bowel incontinence with urinary retention and significant LLE weakness.  Please also see preadmission assessment.   Review of Systems  Constitutional: Negative for chills and fever.  HENT: Negative for hearing loss and  tinnitus.   Eyes: Negative for blurred vision and double vision.  Respiratory: Negative for cough and shortness of breath.   Cardiovascular: Negative for chest pain and palpitations.  Gastrointestinal: Negative for blood in stool, constipation, heartburn and nausea.  Genitourinary: Negative for dysuria and urgency.  Musculoskeletal: Positive for neck pain. Negative for myalgias.  Skin: Positive for rash (facial--chronic ).  Neurological: Positive for sensory change, focal weakness, weakness and headaches. Negative for dizziness and speech change.  Psychiatric/Behavioral: Positive for depression (under control?). The patient has insomnia (chronic--worse).        Has manic episodes  couple of times a month.    All other systems reviewed and are negative.    Past Medical History:  Diagnosis Date  . ADHD   . Bipolar 1 disorder (HCC)   . Chronic anemia   . Diabetes mellitus (HCC)   . Encephalopathy due to infection 09/2018  . History of alcohol abuse   . HTN (hypertension)   . Insomnia   . Meningitis due to bacteria   . Rash    chronic  . Ulnar neuropathy     Past Surgical History:  Procedure Laterality Date  . CERVICAL LAMINECTOMY  10/11/2018   C1- C3 posterior laminectomy with decompression of spinal cord  . CYST EXCISION     from left hip.      Family History  Problem Relation Age of Onset  . Coronary artery disease Father   . Diabetes Father   . Alcohol abuse Brother   . Asperger's syndrome Son   . Asperger's syndrome Daughter      Social History: Married. 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 Lives with wife, 21 year old son and his mother. He was independent PTA. He quit smoking cigarettes in 1980 --he smoked for 18  Has history of alcohol abuse--has been sober since 08/25/13.  He did not use any illicit drugs 1981.    Allergies  Allergen Reactions  . Lisinopril     cough  . Reglan [Metoclopramide]     Tremors   . Tramadol  Other (See Comments)    hallucinations     Medications Prior to Admission  Medication Sig Dispense Refill  . acetaminophen (TYLENOL) 325 MG tablet Take 650 mg by mouth every 6 (six) hours as needed for pain.    . ADDERALL XR 25 MG 24 hr capsule Take 25 mg by mouth daily as needed. Boost of focus  0  . ALPRAZolam (XANAX) 0.5 MG tablet Take 0.5 mg by mouth at bedtime as needed for anxiety or sleep.    . ARIPiprazole (ABILIFY) 15 MG tablet Take 15 mg by mouth daily.  3  . ASPIRIN LOW DOSE 81 MG EC tablet Take 81 mg by mouth daily.  0  . atorvastatin (LIPITOR) 80 MG tablet Take 80 mg by mouth daily.  1  . bacitracin-polymyxin b (POLYSPORIN) ointment Apply 1 application topically 2 (two) times daily. Apply around halo pins    . [START ON 10/24/2018] docusate sodium (COLACE) 100 MG capsule Take 100 mg by mouth daily.    Melene Muller ON 10/24/2018] finasteride (PROSCAR) 5 MG tablet Take 5 mg by mouth daily.    . insulin lispro (HUMALOG) 100 UNIT/ML injection Inject 8 Units into the skin daily with breakfast.    . LANTUS 100 UNIT/ML injection Inject 30 Units into the skin at bedtime.  0  . losartan (COZAAR) 25 MG tablet Take 25 mg by mouth daily.  1  . Melatonin 3 MG TABS Take 3 mg by mouth at bedtime.    . metFORMIN (GLUCOPHAGE) 1000 MG tablet Take 1,000 mg by mouth 2 (two) times daily.  3  . NOVOLOG 100 UNIT/ML injection Inject 10 Units into the skin 3 (three) times daily with meals.  0  . oxyCODONE (OXY IR/ROXICODONE) 5 MG immediate release tablet Take 5 mg by mouth every 6 (six) hours as needed for moderate pain.   0  . sertraline (ZOLOFT) 100 MG tablet Take 150 mg by mouth daily.  3  . tamsulosin (FLOMAX) 0.4 MG CAPS capsule Take 0.4 mg by mouth daily.  1    Drug Regimen Review  Drug regimen was reviewed and remains appropriate with no significant issues identified  Home: Split level home with 2 STE and 5-7 steps inside.    Functional History: Independent prior to Oct admission.    Functional Status:  Mobility: Mod assist +2  with STEDY to stand.        ADL: Min assist for grooming Mod assist UB dressing Max assist LB dressing.  Mod assist toilet transfer.   Cognition:      Physical Exam: Blood pressure 113/80, pulse 94, temperature 98.3 F (36.8 C), resp. rate 20, height 5' 10.5" (1.791 m), weight 74 kg, SpO2 97 %. Physical Exam  Nursing note and vitals reviewed. Constitutional: He is oriented to person, place, and time. He appears well-developed and well-nourished.  HENT:  +Halo  Eyes: EOM are normal. Right eye exhibits no discharge. Left eye exhibits no discharge.  Neck:  Halo in place  Cardiovascular: Normal rate and regular rhythm.  Respiratory: Effort normal and  breath sounds normal.  GI: Soft. Bowel sounds are normal.  Musculoskeletal:  No edema or tenderness in extremities  Neurological: He is alert and oriented to person, place, and time.  Motor: Right upper extreme: 4+/5 proximal distal Right lower extremity: 4+/5 proximal distal Left upper extremity: 4-4+/5 proximal distal Left lower extremity: 4/5 proximal distal Sensation diminished to light touch along left upper extremity ulnar distribution (baseline)  Skin: Skin is warm and dry.  Facial rash along hairline  Psychiatric: He has a normal mood and affect. His behavior is normal. Thought content normal.    Recent Labs:  BMET 11/27:  Na- 140    K- 4.3   CL- 103    CO2- 30  BUN- 18  SCr- 0.6  Glucose 178       Mg- 1.7     CBC 11/14 :  WBC- 6.2   Hgb- 9.4  Hct - 30.4   Plt- 473   CRP 11/18:   6.97   Dg Abd Portable 1v  Result Date: 10/23/2018 CLINICAL DATA:  Constipation EXAM: PORTABLE ABDOMEN - 1 VIEW COMPARISON:  None. FINDINGS: Nonobstructed bowel gas pattern with large amount of stool in the colon. No radiopaque calculi. Probable phleboliths in the pelvis. IMPRESSION: Nonobstructed gas pattern with large volume of stool in the colon Electronically Signed   By: Jasmine Pang  M.D.   On: 10/23/2018 15:07       Medical Problem List and Plan: 1.  Left-sided weakness in coronation deficits,, bowel incontinence, urinary retention secondary to cervical myelopathy status post decompression and stabilization with halo.  2.  DVT Prophylaxis/Anticoagulation: Pharmaceutical: Lovenox 3. Pain Management: Tylenol prn--narcotics have been held due to encephalopathy with hallucinations.  4. Mood: LCSW to follow for evaluation and support.  5. Neuropsych: This patient is capable of making decisions on his own behalf. 6. Skin/Wound Care: Pin care bid. Monitor incision daily for healing. Maintain adequate nutritional and hydration status.  7. Fluids/Electrolytes/Nutrition: Monitor I/O. Check lytes in am.  8. Epidural abscess: Continue Ceftriaxone 2 grams every 12 hours with end date 12/24. Weekly CBC/BMET/CRP/Sed rate 9. Urinary retention/Neurogenic bladder: Continue proscar and flomax. Monitor voiding every 4-6 hours and cath to keep volumes < 350 cc. May need to add urecholine 10. Constipation/Neurogenic bowel:  Had results with dig stim this am. KUB reviewed with significant stool burden.  Will schedule suppository daily in am. 11. Bipolar disorder: has not been taking care of himself for the past few year--started being compliant since mid summer. Team to provide ego support. Continue Zoloft and abilify daily. Atrax prn for anxiety. Limit xanax to bedtime to help with sleep.  12. ABLA: Will recheck CBC in am. 13. T2DM: Was not taking lantus PTA. Continue Lantus daily with novolog 8 units breakfast and 10 units bid. Will monitor BS ac/hs and use SSI for tighter control.      Post Admission Physician Evaluation: 1. Preadmission assessment reviewed and changes made below. 2. Functional deficits secondary  to cervical myelopathy status post decompression and stabilization with halo. 3. Patient is admitted to receive collaborative, interdisciplinary care between the physiatrist,  rehab nursing staff, and therapy team. 4. Patient's level of medical complexity and substantial therapy needs in context of that medical necessity cannot be provided at a lesser intensity of care such as a SNF. 5. Patient has experienced substantial functional loss from his/her baseline which was documented above under the "Functional History" and "Functional Status" headings.  Judging by the patient's diagnosis, physical exam, and  functional history, the patient has potential for functional progress which will result in measurable gains while on inpatient rehab.  These gains will be of substantial and practical use upon discharge  in facilitating mobility and self-care at the household level. 6. Physiatrist will provide 24 hour management of medical needs as well as oversight of the therapy plan/treatment and provide guidance as appropriate regarding the interaction of the two. 7. 24 hour rehab nursing will assist with bladder management, bowel management, safety, skin/wound care, disease management, medication administration, pain management and patient education  and help integrate therapy concepts, techniques,education, etc. 8. PT will assess and treat for/with: Lower extremity strength, range of motion, stamina, balance, functional mobility, safety, adaptive techniques and equipment, wound care, coping skills, pain control, education. Goals are: Supervision/min A. 9. OT will assess and treat for/with: ADL's, functional mobility, safety, upper extremity strength, adaptive techniques and equipment, wound mgt, ego support, and community reintegration.   Goals are: Supervision/min A. Therapy may not proceed with showering this patient. 10. SLP will assess and treat for/with: Cognition.  Goals are: Supervision/mod I. 11. Case Management and Social Worker will assess and treat for psychological issues and discharge planning. 12. Team conference will be held weekly to assess progress toward goals and to  determine barriers to discharge. 13. Patient will receive at least 3 hours of therapy per day at least 5 days per week. 14. ELOS: 13- 16 days.       15. Prognosis:  excellent and good  I have personally performed a face to face diagnostic evaluation, including, but not limited to relevant history and physical exam findings, of this patient and developed relevant assessment and plan.  Additionally, I have reviewed and concur with the physician assistant's documentation above.  Maryla MorrowAnkit Patel, MD, ABPMR Jacquelynn CreePamela S Love, PA-C 10/23/2018

## 2018-10-23 NOTE — Progress Notes (Signed)
[facility] ambulance from [facility] Patient denies pain/discomfort at this time.

## 2018-10-24 ENCOUNTER — Encounter (HOSPITAL_COMMUNITY): Payer: BLUE CROSS/BLUE SHIELD

## 2018-10-24 DIAGNOSIS — K5900 Constipation, unspecified: Secondary | ICD-10-CM

## 2018-10-24 LAB — COMPREHENSIVE METABOLIC PANEL
ALT: 47 U/L — AB (ref 0–44)
AST: 27 U/L (ref 15–41)
Albumin: 2.3 g/dL — ABNORMAL LOW (ref 3.5–5.0)
Alkaline Phosphatase: 76 U/L (ref 38–126)
Anion gap: 8 (ref 5–15)
BUN: 13 mg/dL (ref 6–20)
CO2: 27 mmol/L (ref 22–32)
CREATININE: 0.71 mg/dL (ref 0.61–1.24)
Calcium: 8.3 mg/dL — ABNORMAL LOW (ref 8.9–10.3)
Chloride: 102 mmol/L (ref 98–111)
Glucose, Bld: 178 mg/dL — ABNORMAL HIGH (ref 70–99)
Potassium: 3.8 mmol/L (ref 3.5–5.1)
Sodium: 137 mmol/L (ref 135–145)
TOTAL PROTEIN: 6.3 g/dL — AB (ref 6.5–8.1)
Total Bilirubin: 0.3 mg/dL (ref 0.3–1.2)

## 2018-10-24 LAB — CBC WITH DIFFERENTIAL/PLATELET
ABS IMMATURE GRANULOCYTES: 0.05 10*3/uL (ref 0.00–0.07)
BASOS PCT: 1 %
Basophils Absolute: 0 10*3/uL (ref 0.0–0.1)
EOS ABS: 0.1 10*3/uL (ref 0.0–0.5)
Eosinophils Relative: 2 %
HEMATOCRIT: 31.8 % — AB (ref 39.0–52.0)
Hemoglobin: 9.4 g/dL — ABNORMAL LOW (ref 13.0–17.0)
IMMATURE GRANULOCYTES: 1 %
LYMPHS ABS: 1.5 10*3/uL (ref 0.7–4.0)
Lymphocytes Relative: 25 %
MCH: 25.3 pg — ABNORMAL LOW (ref 26.0–34.0)
MCHC: 29.6 g/dL — ABNORMAL LOW (ref 30.0–36.0)
MCV: 85.5 fL (ref 80.0–100.0)
MONOS PCT: 8 %
Monocytes Absolute: 0.5 10*3/uL (ref 0.1–1.0)
NEUTROS PCT: 63 %
Neutro Abs: 3.9 10*3/uL (ref 1.7–7.7)
PLATELETS: 488 10*3/uL — AB (ref 150–400)
RBC: 3.72 MIL/uL — ABNORMAL LOW (ref 4.22–5.81)
RDW: 19.8 % — AB (ref 11.5–15.5)
WBC: 6.2 10*3/uL (ref 4.0–10.5)
nRBC: 0 % (ref 0.0–0.2)

## 2018-10-24 LAB — GLUCOSE, CAPILLARY
GLUCOSE-CAPILLARY: 122 mg/dL — AB (ref 70–99)
Glucose-Capillary: 136 mg/dL — ABNORMAL HIGH (ref 70–99)
Glucose-Capillary: 160 mg/dL — ABNORMAL HIGH (ref 70–99)
Glucose-Capillary: 297 mg/dL — ABNORMAL HIGH (ref 70–99)

## 2018-10-24 LAB — HEMOGLOBIN A1C
HEMOGLOBIN A1C: 8.8 % — AB (ref 4.8–5.6)
MEAN PLASMA GLUCOSE: 205.86 mg/dL

## 2018-10-24 LAB — MAGNESIUM: Magnesium: 1.7 mg/dL (ref 1.7–2.4)

## 2018-10-24 MED ORDER — SENNOSIDES-DOCUSATE SODIUM 8.6-50 MG PO TABS
2.0000 | ORAL_TABLET | Freq: Every day | ORAL | Status: DC
  Administered 2018-10-24 – 2018-11-05 (×11): 2 via ORAL
  Filled 2018-10-24 (×11): qty 2

## 2018-10-24 MED ORDER — RESOURCE INSTANT PROTEIN PO PWD PACKET
1.0000 | Freq: Three times a day (TID) | ORAL | Status: DC
  Filled 2018-10-24 (×5): qty 6

## 2018-10-24 MED ORDER — BETHANECHOL CHLORIDE 10 MG PO TABS
5.0000 mg | ORAL_TABLET | Freq: Three times a day (TID) | ORAL | Status: DC
  Administered 2018-10-24 – 2018-10-28 (×13): 5 mg via ORAL
  Filled 2018-10-24 (×13): qty 1

## 2018-10-24 NOTE — Progress Notes (Signed)
Inpatient Rehabilitation  Patient information reviewed and entered into eRehab system by Krissie Merrick M. Takisha Pelle, M.A., CCC/SLP, PPS Coordinator.  Information including medical coding, functional ability and quality indicators will be reviewed and updated through discharge.    

## 2018-10-24 NOTE — Progress Notes (Addendum)
[facility] PHYSICAL MEDICINE & REHABILITATION PROGRESS NOTE   Subjective/Complaints: Feels like he needs BM today, reviewed Xray - FOS  Halo pin sites sore  ROS- neg CP, SOB, + constipation , - N/V/D   Objective:   Dg Abd Portable 1v  Result Date: 10/23/2018 CLINICAL DATA:  Constipation EXAM: PORTABLE ABDOMEN - 1 VIEW COMPARISON:  None. FINDINGS: Nonobstructed bowel gas pattern with large amount of stool in the colon. No radiopaque calculi. Probable phleboliths in the pelvis. IMPRESSION: Nonobstructed gas pattern with large volume of stool in the colon Electronically Signed   By: Jasmine Pang M.D.   On: 10/23/2018 15:07   Recent Labs    10/24/18 0500  WBC 6.2  HGB 9.4*  HCT 31.8*  PLT 488*   Recent Labs    10/24/18 0500  NA 137  K 3.8  CL 102  CO2 27  GLUCOSE 178*  BUN 13  CREATININE 0.71  CALCIUM 8.3*    Intake/Output Summary (Last 24 hours) at 10/24/2018 4098 Last data filed at 10/24/2018 0815 Gross per 24 hour  Intake 880 ml  Output 2800 ml  Net -1920 ml     Physical Exam: Vital Signs Blood pressure (!) 141/67, pulse 95, temperature 98.9 F (37.2 C), temperature source Oral, resp. rate 20, height 5' 10.5" (1.791 m), weight 74 kg, SpO2 100 %.   General: No acute distress Head- Halo with pinsites mildy erythematous but no drainage or induration  Mood and affect are appropriate Heart: Regular rate and rhythm no rubs murmurs or extra sounds Lungs: Clear to auscultation, breathing unlabored, no rales or wheezes Abdomen: Positive bowel sounds, soft nontender to palpation, nondistended Extremities: No clubbing, cyanosis, or edema Skin: No evidence of breakdown, no evidence of rash Neurologic: Cranial nerves II through XII intact, motor strength is 5/5 in Right and 4/5 Left deltoid, bicep, tricep, grip, hip flexor, knee extensors, ankle dorsiflexor and plantar flexor Sensory exam normal sensation to light touch and proprioception in bilateral upper and lower  extremities Cerebellar exam normal finger to nose to finger as well as heel to shin in bilateral upper and lower extremities Musculoskeletal: Full range of motion in all 4 extremities. No joint swelling   Assessment/Plan: 1. 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  Physiatrist is providing close team supervision and 24 hour management of active medical problems listed below.  Physiatrist and rehab team continue to assess barriers to discharge/monitor patient progress toward functional and medical goals  Care Tool:  Bathing              Bathing assist       Upper Body Dressing/Undressing Upper body dressing        Upper body assist      Lower Body Dressing/Undressing Lower body dressing            Lower body assist       Toileting Toileting    Toileting assist       Transfers Chair/bed transfer  Transfers assist           Locomotion Ambulation   Ambulation assist              Walk 10 feet activity   Assist           Walk 50 feet activity   Assist           Walk 150 feet activity  Assist           Walk 10 feet on uneven surface  activity   Assist           Wheelchair     Assist               Wheelchair 50 feet with 2 turns activity    Assist            Wheelchair 150 feet activity     Assist           Medical Problem List and Plan: 1.  Left-sided weakness and fine motor deficits,, bowel incontinence, urinary retention secondary to cervical myelopathy status post decompression and stabilization with halo.  CIR evals PT, OT in am 2.  DVT Prophylaxis/Anticoagulation: Pharmaceutical: Lovenox 3. Pain Management: Tylenol prn--narcotics have been held due to encephalopathy with hallucinations.  4. Mood: LCSW to follow for evaluation and support.   5. Neuropsych: This patient is capable of making decisions on his own behalf. 6. Skin/Wound Care: Pin care bid. Monitor incision daily for healing. Maintain adequate nutritional and hydration status.  7. Fluids/Electrolytes/Nutrition: Monitor I/O. Check lytes in am.  8. Epidural abscess: Continue Ceftriaxone 2 grams every 12 hours with end date 12/24. Weekly CBC/BMET/CRP/Sed rate 9. Urinary retention/Neurogenic bladder: Continue proscar and flomax. Monitor voiding every 4-6 hours and cath to keep volumes < 350 cc. Will add urecholine 10. Constipation/Neurogenic bowel:  Had results with dig stim this am. KUB reviewed with significant stool burden.  Will schedule suppository daily in am. 11. Bipolar disorder: has not been taking care of himself for the past few year--started being compliant since mid summer. Team to provide ego support. Continue Zoloft and abilify daily. Atrax prn for anxiety. Limit xanax to bedtime to help with sleep.  12. ABLA:11/28 Hgb 9.4- will monitor 13. T2DM: Was not taking lantus PTA. Continue Lantus daily with novolog 8 units breakfast and 10 units bid. Will monitor BS ac/hs and use SSI for tighter control.  CBG (last 3)  Recent Labs    10/23/18 1630 10/23/18 2112 10/24/18 0639  GLUCAP 164* 122* 136*  controlled 11/28   14.  Hypoalbuminemia- start resource supplement LOS: 1 days A FACE TO FACE EVALUATION WAS PERFORMED  Erick Colacendrew E Kirsteins 10/24/2018, 9:23 AM

## 2018-10-25 ENCOUNTER — Inpatient Hospital Stay (HOSPITAL_COMMUNITY): Payer: BLUE CROSS/BLUE SHIELD | Admitting: Occupational Therapy

## 2018-10-25 ENCOUNTER — Inpatient Hospital Stay (HOSPITAL_COMMUNITY): Payer: BLUE CROSS/BLUE SHIELD

## 2018-10-25 ENCOUNTER — Inpatient Hospital Stay (HOSPITAL_COMMUNITY): Payer: BLUE CROSS/BLUE SHIELD | Admitting: Physical Therapy

## 2018-10-25 DIAGNOSIS — R609 Edema, unspecified: Secondary | ICD-10-CM

## 2018-10-25 LAB — GLUCOSE, CAPILLARY
Glucose-Capillary: 154 mg/dL — ABNORMAL HIGH (ref 70–99)
Glucose-Capillary: 167 mg/dL — ABNORMAL HIGH (ref 70–99)
Glucose-Capillary: 153 mg/dL — ABNORMAL HIGH (ref 70–99)
Glucose-Capillary: 138 mg/dL — ABNORMAL HIGH (ref 70–99)

## 2018-10-25 MED ORDER — BENEPROTEIN PO POWD
1.0000 | Freq: Three times a day (TID) | ORAL | Status: DC
  Administered 2018-10-25 – 2018-11-06 (×14): 6 g via ORAL
  Filled 2018-10-25: qty 227

## 2018-10-25 NOTE — Progress Notes (Signed)
Physical Therapy Assessment and Plan  Patient Details  Name: Jonathan Horne MRN: [id number] Date of Birth: February 25, 1961  PT Diagnosis: Abnormal posture, Abnormality of gait, Ataxia, Difficulty walking and Muscle weakness Rehab Potential: Good ELOS: 14-17 days   Today's Date: 10/25/2018 PT Individual Time: 0800-0900 PT Individual Time Calculation (min): 60 min    Problem List:  Patient Active Problem List   Diagnosis Date Noted  . Cervical myelopathy ([place]) 10/23/2018  . Constipation   . Encephalopathy   . Neurogenic bladder   . Epidural abscess   . Neurogenic bowel   . Bipolar disorder ([place])   . Acute blood loss anemia   . Diabetes mellitus type 2 in nonobese [facility])     Past Medical History:  Past Medical History:  Diagnosis Date  . ADHD   . Bipolar 1 disorder ([place])   . Chronic anemia   . Diabetes mellitus ([place])   . Encephalopathy due to infection 09/2018  . History of alcohol abuse   . HTN (hypertension)   . Insomnia   . Meningitis due to bacteria   . Rash    chronic  . Ulnar neuropathy    Past Surgical History:  Past Surgical History:  Procedure Laterality Date  . CERVICAL LAMINECTOMY  10/11/2018   C1- C3 posterior laminectomy with decompression of spinal cord  . CYST EXCISION     from left hip.     Assessment & Plan Clinical Impression:  Jonathan Horne  a 57 year old male with history of T2DM, bipolar disorder, recent admission for pharyngeal dysphagia with strep UTI 10/27- 09/27/18.  History taken from chart review and patient.  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  He was discharged to home with 3 weeks course antibiotic but continued to have severe neck pain progressing LUE numbness and weakness as well as LLE numbness and to hallucinations.  He was admitted to [facility] on 10/07/2018 for work-up.  He was noted to be tachycardic and treated with fluid  bolus and x-rays done revealing progression of epidural abscess with compression of upper cervical cord with increased signal at atlantal occipital articulation with suspicion of instability, soft tissue thickening can concerning for inflammation C1-C2 with phlegmon and concerns for developing abscess.  He was taken to the OR for C1-C3 laminectomy and decompression of spinal cord on 10/11/18 by Dr. Cristi Loron. Halo placed for stabilization and to remain in place with pin care bid. Blood cultures X 2 11/14 were negative.  Hospital course significant for encephalopathy with hallucinations.  Tramadol and Adderall were discontinued.  Home dose Xanax decrease to bedtime due to daytime sedation.  He is also had issues with urinary retention requiring in and out caths as well as constipation. He is to continue IV ceftriaxone through 11/19/2018.  He has had improvement in LUE strength and coordination but continues to have bowel incontinence with urinary retention and significant LLE weakness.  Please also see preadmission assessment. Patient transferred to CIR on 10/23/2018 .   Patient currently requires mod with mobility secondary to muscle weakness and decreased sitting balance, decreased standing balance, decreased postural control and decreased balance strategies.  Prior to hospitalization, patient was independent  with mobility and lived with Spouse, Family, Son in a House home.  Home access is 2Stairs to enter.  Patient will benefit from skilled PT intervention to maximize safe functional mobility, minimize fall risk and decrease caregiver burden for  planned discharge home with 24 hour supervision.  Anticipate patient will benefit from follow up [place] at discharge.  PT - End of Session Activity Tolerance: Tolerates 10 - 20 min activity with multiple rests Endurance Deficit: Yes Endurance Deficit Description: frequent rest breaks between activities PT Assessment Rehab Potential (ACUTE/IP ONLY): Good PT  Barriers to Discharge: Medical stability;Home environment access/layout PT Patient demonstrates impairments in the following area(s): Balance;Endurance;Pain;Perception;Safety PT Transfers Functional Problem(s): Bed Mobility;Bed to Chair;Car;Furniture;Floor PT Locomotion Functional Problem(s): Ambulation;Wheelchair Mobility;Stairs PT Plan PT Intensity: Minimum of 1-2 x/day ,45 to 90 minutes PT Frequency: 5 out of 7 days PT Duration Estimated Length of Stay: 14-17 days PT Treatment/Interventions: Ambulation/gait training;Balance/vestibular training;Community reintegration;Discharge planning;Disease management/prevention;DME/adaptive equipment instruction;Functional mobility training;Neuromuscular re-education;Pain management;Patient/family education;Psychosocial support;Stair training;Therapeutic Exercise;Therapeutic Activities;UE/LE Strength taining/ROM;UE/LE Coordination activities;Wheelchair propulsion/positioning PT Transfers Anticipated Outcome(s): Supervision PT Locomotion Anticipated Outcome(s): Supervision with LRAD PT Recommendation Recommendations for Other Services: Neuropsych consult;Therapeutic Recreation consult Therapeutic Recreation Interventions: Stress management;Outing/community reintergration Follow Up Recommendations: Home health PT Patient destination: Home Equipment Recommended: Rolling walker with 5" wheels;To be determined Equipment Details: RW  Skilled Therapeutic Intervention Evaluation completed (see details above and below) with education on PT POC and goals and individual treatment initiated with focus on functional transfer assessment, education with patient about spinal precautions, ELOS, rehab goals, etc. Pt received semi-reclined in bed, agreeable to PT eval. See pain details below, RN provided pain medication during therapy session. Supine to sit with mod A for trunk control. Sit to stand with min A to stedy with use of BUE pulling and with mod A to RW with use of  one UE on RW. Stedy transfer bed to/from w/c. Stand pivot transfer bed to/from w/c with RW and mod A for balance. Pt is very unsteady on his feet due to decreased balance and visual field deficits from Halo brace. Pt is able to ambulate x 5 ft with RW and mod A with very narrow BOS and slightly ataxic gait. Sit to supine mod A for trunk control, v/c for log roll technique. Pt left semi-reclined in bed with needs in reach, bed alarm in place.  PT Evaluation Precautions/Restrictions Precautions Precautions: Cervical;Fall;Other (comment) Precaution Comments: HALO brace Restrictions Weight Bearing Restrictions: No Pain Pain Assessment Pain Scale: 0-10 Pain Score: 4  Pain Type: Acute pain Pain Location: Neck Pain Orientation: Proximal;Upper Pain Descriptors / Indicators: Aching;Dull Pain Frequency: Constant Pain Onset: On-going Pain Intervention(s): Medication (See eMAR) Home Living/Prior Functioning Home Living Available Help at Discharge: Family;Available 24 hours/day Type of Home: House Home Access: Stairs to enter [employer] of Steps: 2 Entrance Stairs-Rails: None Home Layout: Multi-level(split level) Alternate Level Stairs-Number of Steps: 7 Alternate Level Stairs-Rails: Right  Lives With: Spouse;Family;Son Prior Function Level of Independence: Independent with gait;Independent with transfers  Able to Take Stairs?: Yes Driving: No Vocation: Full time employment 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, computer work Vision/Perception  Vision - History Baseline Vision: Wears glasses all the time Geologist, engineering: Within Functional Limits Praxis Praxis: Intact  Cognition Overall Cognitive Status: Within Functional Limits for tasks assessed Arousal/Alertness: Awake/alert Orientation Level: Oriented X4 Attention: Sustained Sustained Attention: Appears intact Memory: Appears intact Awareness: Appears intact Problem Solving: Appears  intact Safety/Judgment: Appears intact Sensation Sensation Light Touch: Appears Intact Proprioception: Impaired by gross assessment(slightly impairment BLE) Coordination Gross Motor Movements are Fluid and Coordinated: No Coordination and Movement Description: impaired by generalized weakness Heel Shin Test: decreased speed and coordination BLE Motor  Motor Motor: Within Functional Limits  Mobility Bed Mobility Bed Mobility: Rolling  Right;Rolling Left;Supine to Sit;Sit to Supine Rolling Right: Minimal Assistance - Patient > 75% Rolling Left: Minimal Assistance - Patient > 75% Supine to Sit: Moderate Assistance - Patient 50-74% Sit to Supine: Moderate Assistance - Patient 50-74% Transfers Transfers: Sit to Stand;Stand to Sit;Stand Pivot Transfers;Transfer via Lift Equipment Sit to Stand: Moderate Assistance - Patient 50-74% Stand to Sit: Moderate Assistance - Patient 50-74% Stand Pivot Transfers: Moderate Assistance - Patient 50 - 74% Stand Pivot Transfer Details: Verbal cues for sequencing;Verbal cues for technique;Verbal cues for precautions/safety;Manual facilitation for weight shifting Transfer (Assistive device): Rolling walker Transfer via Lift Equipment: Microbiologist (Feet): 5 Feet Assistive device: Rolling walker Gait Gait Pattern: Impaired(slightly ataxic, narrow BOS) Gait velocity: decreased  Trunk/Postural Assessment  Cervical Assessment Cervical Assessment: Exceptions to WFL(ROM limited by HALO) Thoracic Assessment Thoracic Assessment: Exceptions to WFL(rounded shoulders) Lumbar Assessment Lumbar Assessment: Within Functional Limits Postural Control Postural Control: Deficits on evaluation Righting Reactions: delayed  Balance Balance Balance Assessed: Yes Static Sitting Balance Static Sitting - Balance Support: Feet supported;No upper extremity supported Static Sitting - Level of Assistance: 5: Stand by assistance Dynamic Sitting  Balance Dynamic Sitting - Balance Support: No upper extremity supported;Feet supported Dynamic Sitting - Level of Assistance: 4: Min assist Static Standing Balance Static Standing - Balance Support: Bilateral upper extremity supported;During functional activity Static Standing - Level of Assistance: 4: Min assist Extremity Assessment   RLE Assessment RLE Assessment: Within Functional Limits Active Range of Motion (AROM) Comments: tight HS General Strength Comments: 5/5 grossly LLE Assessment LLE Assessment: Within Functional Limits Active Range of Motion (AROM) Comments: tight HS General Strength Comments: 4/5 grossly    Refer to Care Plan for Long Term Goals  Recommendations for other services: Neuropsych and Therapeutic Recreation  Stress management and Outing/community reintegration  Discharge Criteria: Patient will be discharged from PT if patient refuses treatment 3 consecutive times without medical reason, if treatment goals not met, if there is a change in medical status, if patient makes no progress towards goals or if patient is discharged from hospital.  The above assessment, treatment plan, treatment alternatives and goals were discussed and mutually agreed upon: by patient  Excell Seltzer, PT, DPT  10/25/2018, 9:45 AM

## 2018-10-25 NOTE — Progress Notes (Signed)
Inpatient Rehabilitation Center Individual Statement of Services  Patient Name:  Jonathan MatteMark T Cue  Date:  10/25/2018  Welcome to the Inpatient Rehabilitation Center.  Our goal is to provide you with an individualized program based on your diagnosis and situation, designed to meet your specific needs.  With this comprehensive rehabilitation program, you will be expected to participate in at least 3 hours of rehabilitation therapies Monday-Friday, with modified therapy programming on the weekends.  Your rehabilitation program will include the following services:  Physical Therapy (PT), Occupational Therapy (OT), 24 hour per day rehabilitation nursing, Neuropsychology, Case Management (Social Worker), Rehabilitation Medicine, Nutrition Services and Pharmacy Services  Weekly team conferences will be held on Tuesdays to discuss your progress.  Your Social Worker will talk with you frequently to get your input and to update you on team discussions.  Team conferences with you and your family in attendance may also be held.  Expected length of stay: 14 to 17 days  Overall anticipated outcome: Supervision with minimal assistance for stairs  Depending on your progress and recovery, your program may change. Your Social Worker will coordinate services and will keep you informed of any changes. Your Social Worker's name and contact numbers are listed  below.  The following services may also be recommended but are not provided by the Inpatient Rehabilitation Center:   Driving Evaluations  Home Health Rehabiltiation Services  Outpatient Rehabilitation Services  Vocational Rehabilitation   Arrangements will be made to provide these services after discharge if needed.  Arrangements include referral to agencies that provide these services.  Your insurance has been verified to be:  [employer] Your primary doctor is:  [facility]  Pertinent information will be shared with your doctor and your  insurance company.  Social Worker:  Staci AcostaJenny Deagen Krass, LCSW  [phone] or ([phone]) [phone]  Information discussed with and copy given to patient by: Elvera LennoxPrevatt, Kanton Kamel Capps, 10/25/2018, 5:08 PM

## 2018-10-25 NOTE — Progress Notes (Signed)
Initial Nutrition Assessment  DOCUMENTATION CODES:   Non-severe (moderate) malnutrition in context of acute illness/injury  INTERVENTION:  - Continue Glucerna Shake TID, each supplement provides 220 kcal and 10 grams of protein. - Continue 30 mL Prostat BID, each supplement provides 100 kcal and 15 grams of protein. - Continue to encourage PO intakes.    NUTRITION DIAGNOSIS:   Moderate Malnutrition related to acute illness as evidenced by mild fat depletion, mild muscle depletion.  GOAL:   Patient will meet greater than or equal to 90% of their needs  MONITOR:   PO intake, Supplement acceptance, Weight trends, Labs, Skin  REASON FOR ASSESSMENT:   Malnutrition Screening Tool  ASSESSMENT:   57 year old male with history of Type 2 DM, bipolar disorder, recent admission for pharyngeal dysphagia with strep UTI. 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 He was discharged to home with 3 weeks course antibiotic but continued to have severe neck pain progressing LUE numbness and weakness as well as LLE numbness and to hallucinations. He was taken to the OR for C1-C3 laminectomy and decompression of spinal cord on 11/15.  BMI indicates normal weight. Patient reports eating very well since admission to rehab. Per review of flowsheet, he consumed 100% of breakfast and lunch (total of 1282 kcal, 66 grams of protein) and dinner brought in by family yesterday; today he ate 100% of breakfast and lunch (total of 1236 kcal, 67 grams of protein). No chewing or swallowing difficulties.   Per review of orders, patient has accepted 4 of 6 bottles of Glucerna Shake and 4 of 5 packets of Prostat. Patient confirms this and understands the importance of adequate nutrition and especially adequate protein for healing and during rehab.  Limited weight hx available; will need to ask patient more about weight trends PTA at  follow-up visit.    Medications reviewed; sliding scale Novolog, 10 units Novolog BID, 8 units Novolog once/day, 28 units Lantus once/day, 2 tablets Senokot once/day. Labs reviewed; CBGs: 154 and 167 mg/dL today, Ca: 8.3 mg/dL.      NUTRITION - FOCUSED PHYSICAL EXAM:    Most Recent Value  Orbital Region  No depletion  Upper Arm Region  Mild depletion  Thoracic and Lumbar Region  Mild depletion  Buccal Region  No depletion  Temple Region  No depletion  Clavicle Bone Region  Mild depletion  Clavicle and Acromion Bone Region  Mild depletion  Scapular Bone Region  No depletion  Dorsal Hand  No depletion  Patellar Region  Mild depletion  Anterior Thigh Region  Unable to assess  Posterior Calf Region  Mild depletion  Edema (RD Assessment)  None  Hair  Reviewed  Eyes  Reviewed  Mouth  Reviewed  Skin  Reviewed  Nails  Reviewed       Diet Order:   Diet Order            Diet Carb Modified Fluid consistency: Thin; Room service appropriate? Yes  Diet effective now              EDUCATION NEEDS:   No education needs have been identified at this time  Skin:  Skin Assessment: Skin Integrity Issues: Skin Integrity Issues:: Stage I Stage I: sacrum  Last BM:  11/29  Height:   Ht Readings from Last 1 Encounters:  10/23/18 5' 10.5" (1.791 m)    Weight:   Wt Readings from Last 1 Encounters:  10/23/18 74 kg  Ideal Body Weight:  76.81 kg  BMI:  Body mass index is 23.08 kg/m.  Estimated Nutritional Needs:   Kcal:  1324-4010 kcal  Protein:  90-105 grams  Fluid:  >/= 2.1 L/day     Trenton Gammon, MS, RD, LDN, [facility] Inpatient Clinical Dietitian Pager # [phone] After hours/weekend pager # [phone]

## 2018-10-25 NOTE — Evaluation (Signed)
Occupational Therapy Assessment and Plan  Patient Details  Name: Jonathan Horne MRN: [id number] Date of Birth: [birth year]  OT Diagnosis: abnormal posture, acute pain, ataxia, muscle weakness (generalized) and coordination disorder Rehab Potential: Rehab Potential (ACUTE ONLY): Good ELOS: 14-17 days   Today's Date: 10/25/2018 OT Individual Time: 1000-1100 OT Individual Time Calculation (min): 60 min     Problem List:  Patient Active Problem List   Diagnosis Date Noted  . Cervical myelopathy ([place]) 10/23/2018  . Constipation   . Encephalopathy   . Neurogenic bladder   . Epidural abscess   . Neurogenic bowel   . Bipolar disorder ([place])   . Acute blood loss anemia   . Diabetes mellitus type 2 in nonobese [facility])     Past Medical History:  Past Medical History:  Diagnosis Date  . ADHD   . Bipolar 1 disorder ([place])   . Chronic anemia   . Diabetes mellitus ([place])   . Encephalopathy due to infection 09/2018  . History of alcohol abuse   . HTN (hypertension)   . Insomnia   . Meningitis due to bacteria   . Rash    chronic  . Ulnar neuropathy    Past Surgical History:  Past Surgical History:  Procedure Laterality Date  . CERVICAL LAMINECTOMY  10/11/2018   C1- C3 posterior laminectomy with decompression of spinal cord  . CYST EXCISION     from left hip.     Assessment & Plan Clinical Impression: Patient is a 57 y.o. year old male with history of T2DM, bipolar disorder, recent admission for pharyngeal dysphagia with strep UTI 10/27- 09/27/18.  History taken from chart review and patient.  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  He was discharged to home with 3 weeks course antibiotic but continued to have severe neck pain progressing LUE numbness and weakness as well as LLE numbness and to hallucinations.  He was admitted to [facility] on 10/07/2018 for work-up.  He was  noted to be tachycardic and treated with fluid bolus and x-rays done revealing progression of epidural abscess with compression of upper cervical cord with increased signal at atlantal occipital articulation with suspicion of instability, soft tissue thickening can concerning for inflammation C1-C2 with phlegmon and concerns for developing abscess.  He was taken to the OR for C1-C3 laminectomy and decompression of spinal cord on 10/11/18 by Dr. Cristi Loron. Halo placed for stabilization and to remain in place with pin care bid. Blood cultures X 2 11/14 were negative.  Hospital course significant for encephalopathy with hallucinations.  Tramadol and Adderall were discontinued.  Home dose Xanax decrease to bedtime due to daytime sedation.  He is also had issues with urinary retention requiring in and out caths as well as constipation. He is to continue IV ceftriaxone through 11/19/2018.  He has had improvement in LUE strength and coordination but continues to have bowel incontinence with urinary retention and significant LLE weakness..  Patient transferred to CIR on 10/23/2018 .    Patient currently requires mod - total A with basic self-care skills secondary to muscle weakness, decreased cardiorespiratoy endurance and decreased sitting balance, decreased balance strategies and coordination, and precautions.  Prior to hospitalization, patient could complete ADLs and IADLs with independent .  Patient will benefit from skilled intervention to increase independence with basic self-care skills prior to discharge home with care partner.  Anticipate patient will require 24 hour supervision  and follow up home health.  OT - End of Session Activity Tolerance: Decreased this session Endurance Deficit: Yes Endurance Deficit Description: frequent rest breaks between activities OT Assessment Rehab Potential (ACUTE ONLY): Good OT Barriers to Discharge: Other (comments) OT Barriers to Discharge Comments: none known  at this time OT Patient demonstrates impairments in the following area(s): Balance;Endurance;Motor;Pain;Safety OT Basic ADL's Functional Problem(s): Grooming;Bathing;Dressing;Toileting OT Advanced ADL's Functional Problem(s): Simple Meal Preparation OT Transfers Functional Problem(s): Toilet OT Additional Impairment(s): None OT Plan OT Intensity: Minimum of 1-2 x/day, 45 to 90 minutes OT Frequency: 5 out of 7 days OT Duration/Estimated Length of Stay: 14-17 days OT Treatment/Interventions: Balance/vestibular training;DME/adaptive equipment instruction;Patient/family education;Therapeutic Activities;Cognitive remediation/compensation;Psychosocial support;Therapeutic Exercise;Community reintegration;Functional mobility training;Self Care/advanced ADL retraining;Discharge planning;UE/LE Strength taining/ROM;UE/LE Coordination activities;Pain management OT Basic Self-Care Anticipated Outcome(s): supervision OT Toileting Anticipated Outcome(s): supervision OT Bathroom Transfers Anticipated Outcome(s): supervision OT Recommendation Recommendations for Other Services: Neuropsych consult;Therapeutic Recreation consult Therapeutic Recreation Interventions: [place] group Patient destination: Home Follow Up Recommendations: Home health OT;24 hour supervision/assistance Equipment Recommended: To be determined   Skilled Therapeutic Intervention Upon entering the room, pt supine in bed with 5/10 c/o pain in neck with RN notified. Pt agreeable to OT intervention. OT educated pt on OT purpose, POC, and goals with pt verbalizing understanding and agreement. Pt performed supine >sit with log roll technique and mod A to EOB. Pt standing with mod lifting assistance and ambulating to to sink with RW and mod A for safety. Pt taking seated rest break and then standing for 2 minutes to brush teeth with min A. Pt taking seated rest break and OT discussed need for button down shirts for UB dressing secondary to HALO.  Pt required total A to don this session. He is unable to look down to see buttons of shirt to fasten. OT assisted pt via wheelchair to gym to familiarize himself with expectations and show equipment. Pt asking appropriate questions. Trial practice of use of Stedy for RN staff to transfer pt from bed >commode chair with mod lifting assist to stand into frame. Pt returning to bed at end of session secondary to comfort. Bed alarm activated and call bell within reach upon exiting the room.   OT Evaluation Precautions/Restrictions  Precautions Precautions: Cervical;Fall;Other (comment);Back Precaution Comments: HALO brace Restrictions Weight Bearing Restrictions: No Pain Pain Assessment Pain Scale: 0-10 Pain Score: 5  Pain Type: Acute pain Pain Location: Neck Pain Orientation: Proximal;Upper Pain Descriptors / Indicators: Aching;Dull Pain Frequency: Constant Pain Onset: On-going Patients Stated Pain Goal: 2 Pain Intervention(s): Repositioned Home Living/Prior Functioning Home Living Family/patient expects to be discharged to:: Private residence Living Arrangements: Spouse/significant other Available Help at Discharge: Family, Available 24 hours/day Type of Home: House Home Access: Stairs to enter Technical brewer of Steps: 2 Entrance Stairs-Rails: None Home Layout: Multi-level Alternate Level Stairs-Number of Steps: 7 Alternate Level Stairs-Rails: Right Bathroom Shower/Tub: Chiropodist: Standard Bathroom Accessibility: Yes Additional Comments: split level home, will need to manuver up steps to get to bathroom/bedroom  Lives With: Spouse, Family, Son Prior Function Level of Independence: Independent with gait, Independent with transfers  Able to Take Stairs?: Yes Driving: No Vocation: Full time employment 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, computer work Vision Baseline Vision/History: Wears glasses Wears Glasses: At all times Perception   Perception: Within Functional Limits Praxis Praxis: Intact Cognition Overall Cognitive Status: Within Functional Limits for tasks assessed Arousal/Alertness: Awake/alert Orientation Level: Person;Place;Situation Person: Oriented Place: Oriented Situation: Oriented Year: 2019 Month: November Day of Week: Correct  Memory: Appears intact Immediate Memory Recall: Sock;Blue;Bed Memory Recall: Sock;Blue;Bed Memory Recall Sock: Without Cue Memory Recall Blue: Without Cue Memory Recall Bed: Without Cue Attention: Sustained Sustained Attention: Appears intact Awareness: Appears intact Problem Solving: Appears intact Safety/Judgment: Appears intact Sensation Sensation Light Touch: Appears Intact Proprioception: Impaired by gross assessment(slightly impairment BLE) Coordination Gross Motor Movements are Fluid and Coordinated: No Fine Motor Movements are Fluid and Coordinated: No Coordination and Movement Description: impaired by generalized weakness Heel Shin Test: decreased speed and coordination BLE Motor  Motor Motor: Within Functional Limits Motor - Skilled Clinical Observations: generalized weakness Mobility  Bed Mobility Bed Mobility: Rolling Right;Rolling Left;Supine to Sit;Sit to Supine Rolling Right: Minimal Assistance - Patient > 75% Rolling Left: Minimal Assistance - Patient > 75% Supine to Sit: Moderate Assistance - Patient 50-74% Sit to Supine: Moderate Assistance - Patient 50-74% Transfers Sit to Stand: Moderate Assistance - Patient 50-74% Stand to Sit: Moderate Assistance - Patient 50-74%  Trunk/Postural Assessment  Cervical Assessment Cervical Assessment: Exceptions to WFL(HALO) Thoracic Assessment Thoracic Assessment: Exceptions to WFL(rounded shoulder) Lumbar Assessment Lumbar Assessment: Within Functional Limits Postural Control Postural Control: Deficits on evaluation Righting Reactions: delayed  Balance Balance Balance Assessed: Yes Static  Sitting Balance Static Sitting - Balance Support: Feet supported;No upper extremity supported Static Sitting - Level of Assistance: 5: Stand by assistance Dynamic Sitting Balance Dynamic Sitting - Balance Support: No upper extremity supported;Feet supported Dynamic Sitting - Level of Assistance: 4: Min assist Static Standing Balance Static Standing - Balance Support: Bilateral upper extremity supported;During functional activity Static Standing - Level of Assistance: 4: Min assist Extremity/Trunk Assessment RUE Assessment RUE Assessment: Within Functional Limits LUE Assessment LUE Assessment: Within Functional Limits     Refer to Care Plan for Long Term Goals  Recommendations for other services: Neuropsych and Therapeutic Recreation  Kitchen group   Discharge Criteria: Patient will be discharged from OT if patient refuses treatment 3 consecutive times without medical reason, if treatment goals not met, if there is a change in medical status, if patient makes no progress towards goals or if patient is discharged from hospital.  The above assessment, treatment plan, treatment alternatives and goals were discussed and mutually agreed upon: by patient  Gypsy Decant 10/25/2018, 11:48 AM

## 2018-10-25 NOTE — Progress Notes (Signed)
Social Work Assessment and Plan  Patient Details  Name: Jonathan Horne MRN: [id number] Date of Birth: [birth year]  Today's Date: 10/25/2018  Problem List:  Patient Active Problem List   Diagnosis Date Noted  . Cervical myelopathy ([place]) 10/23/2018  . Constipation   . Encephalopathy   . Neurogenic bladder   . Epidural abscess   . Neurogenic bowel   . Bipolar disorder ([place])   . Acute blood loss anemia   . Diabetes mellitus type 2 in nonobese [facility])    Past Medical History:  Past Medical History:  Diagnosis Date  . ADHD   . Bipolar 1 disorder ([place])   . Chronic anemia   . Diabetes mellitus ([place])   . Encephalopathy due to infection 09/2018  . History of alcohol abuse   . HTN (hypertension)   . Insomnia   . Meningitis due to bacteria   . Rash    chronic  . Ulnar neuropathy    Past Surgical History:  Past Surgical History:  Procedure Laterality Date  . CERVICAL LAMINECTOMY  10/11/2018   C1- C3 posterior laminectomy with decompression of spinal cord  . CYST EXCISION     from left hip.    Social History:  reports that he quit smoking about 39 years ago. His smoking use included cigarettes. He has never used smokeless tobacco. His alcohol and drug histories are not on file.  Family / Support Systems Marital Status: Married How Long?: 23 years Patient Roles: Spouse, Parent Spouse/Significant Other: [name] - wife - [phone] Children: 4 children - 2 are grown; son at home (81 y/o) and dtr at [employer] (58 y/o) Anticipated Caregiver: Spouse and Son will alternate shifts during the day Ability/Limitations of Caregiver: Min; wife was Education officer, environmental, per Development worker, international aid - CSW to confirm Caregiver Availability: 24/7 Family Dynamics: supportive family  Social History Preferred language: English Religion: None Read: Yes Write: Yes Employment Status: Employed Name of Employer: works from home  Return to Work Plans: Pt can work as much or as little as he recovers from  surgery. Legal History/Current Legal Issues: none reported Guardian/Conservator: N/A - MD has determined that pt is capable of making his own decisions.   Abuse/Neglect Abuse/Neglect Assessment Can Be Completed: Yes Physical Abuse: Denies Verbal Abuse: Denies Sexual Abuse: Denies Exploitation of patient/patient's resources: Denies Self-Neglect: Denies  Emotional Status Pt's affect, behavior and adjustment status: Pt reports being up and down emotionally.  He is feeling okay right now, but very tired. Recent Psychosocial Issues: Pt is a little concerned about finances and depending on others. Psychiatric History: Pt with a hx of bipolar disorder - is on medications that are working for him. Substance Abuse History: Pt with a remote hx of alcohol abuse, but he found once he was on the right medications for his bipolar disorder he was able to stop drinking and never really craved it again.  Patient / Family Perceptions, Expectations & Goals Pt/Family understanding of illness & functional limitations: Pt reports a good understanding of his condition/limitations. Premorbid pt/family roles/activities: Pt worked full time from home. Anticipated changes in roles/activities/participation: Would like to resume working as he is able. Pt/family expectations/goals: Pt wants to be able to get up and move about at home on his own and to regain his independence.  Community Resources [employer] Scripts: None Premorbid Home Care/DME Agencies: None Transportation available at discharge: family Resource referrals recommended: Neuropsychology  Discharge Planning Living Arrangements: Spouse/significant other, Children Support Systems: Spouse/significant other, Children, Other relatives, Friends/neighbors  Type of Residence: Private residence Insurance Resources: Multimedia programmer (specify)(BCBS) Financial Resources: Employment Financial Screen Referred: No Money Management: Patient, Spouse Does the  patient have any problems obtaining your medications?: No Home Management: Pt and spouse share this, but wife and son can manage while pt recovers. Patient/Family Preliminary Plans: Pt's wife and son plan to take turns being with pt at home. Social Work Anticipated Follow Up Needs: HH/OP Expected length of stay: 14 to 17 days  Clinical Impression CSW met with pt to introduce self and role of CSW, as well as to complete assessment.  Pt was open and talkative with CSW about his condition, bipolar disorder, and hx of alcohol use.  Pt feels he is managing pretty well emotionally, better before surgery, but is still doing fairly well, with expected ups and downs.He has good family support and reports his relationships have improved since he started medications for bipolar disorder 7 years ago.  He doesn't necessarily like how the medications make him feel, but overall, he knows it's what is best for him.  He hopes to return to [facility] soon and be with his family.  No current concerns, questions, needs at this time.  CSW will continue to follow and assist as needed.  Kashara Blocher, Silvestre Mesi 10/25/2018, 6:08 PM

## 2018-10-25 NOTE — Progress Notes (Signed)
Physical Therapy Session Note  Patient Details  Name: Jonathan Horne MRN: [id number] Date of Birth: Jun 05, 1961  Today's Date: 10/25/2018 PT Individual Time: 8835-8446 PT Individual Time Calculation (min): 70 min   Short Term Goals: Week 1:  PT Short Term Goal 1 (Week 1): Pt will complete bed mobility with min A consistently PT Short Term Goal 2 (Week 1): Pt will complete least restrictive transfer with min A consistently PT Short Term Goal 3 (Week 1): Pt will ambulate x 25 ft with LRAD and min A  Skilled Therapeutic Interventions/Progress Updates:    Pt received supine in bed and agreeable to PT. Stand pivot transfer with RW to WC x3, min assist from PT. Noted slight buckling of L knee during transfers; pt able to self correct with no LOB.   Self propelled WC ~100 ft down hall x2, supervision assist from PT.  Switched WC to reclining 16x18 chair for pain management, improved sitting tolerance, pressure relief, and improved safety of chair with cervical halo.  Gait training with RW 65 ft, 60 ft; min assist from PT. Narrow BOS. Mild trendelenburg noted during L stance phase. Excessive knee flexion with pt increase in fatigue. No LOB. Verbal cues to widen BOS during gait training.  Stair training. Step up/down 3" stairs x4. Step up/down 6" stairs x1. Min assist from PT. Verbal cues for LE sequencing. Noted some proprioceptive deficits with LLE; potentially due to residual numbness in LLE and/or visual deficits with cervical halo.   Car transfer; stand pivot with RW. Min assist from SPT. Verbal and tactile cuing for safe transfer with cervical halo in place. Pt was able to clear door frame but required cues for positioning and to slow pace of transfer.   Ended session with pt supine in bed, call bell within reach, and all needs met.   Therapy Documentation Precautions:  Precautions Precautions: Cervical, Fall, Other (comment), Back Precaution Comments: HALO brace Restrictions Weight  Bearing Restrictions: No   Pain: Pain Assessment Pain Scale: 0-10 Pain Score: 5  Pain Type: Acute pain Pain Location: Neck Pain Orientation: Anterior;Upper Pain Descriptors / Indicators: Aching;Discomfort;Dull Pain Frequency: Constant Pain Onset: On-going Pain Intervention(s): Medication (See eMAR)   Therapy/Group: Individual Therapy  Amador Cunas 10/25/2018, 5:34 PM

## 2018-10-25 NOTE — Progress Notes (Signed)
[facility] PHYSICAL MEDICINE & REHABILITATION PROGRESS NOTE   Subjective/Complaints:  No pains, amb with PT without dizziness  ROS- neg CP, SOB, + constipation , - N/V/D   Objective:   Dg Abd Portable 1v  Result Date: 10/23/2018 CLINICAL DATA:  Constipation EXAM: PORTABLE ABDOMEN - 1 VIEW COMPARISON:  None. FINDINGS: Nonobstructed bowel gas pattern with large amount of stool in the colon. No radiopaque calculi. Probable phleboliths in the pelvis. IMPRESSION: Nonobstructed gas pattern with large volume of stool in the colon Electronically Signed   By: Jasmine Pang M.D.   On: 10/23/2018 15:07   Recent Labs    10/24/18 0500  WBC 6.2  HGB 9.4*  HCT 31.8*  PLT 488*   Recent Labs    10/24/18 0500  NA 137  K 3.8  CL 102  CO2 27  GLUCOSE 178*  BUN 13  CREATININE 0.71  CALCIUM 8.3*    Intake/Output Summary (Last 24 hours) at 10/25/2018 0811 Last data filed at 10/25/2018 0756 Gross per 24 hour  Intake 460 ml  Output 3950 ml  Net -3490 ml     Physical Exam: Vital Signs Blood pressure 108/75, pulse 90, temperature 98.5 F (36.9 C), resp. rate 18, height 5' 10.5" (1.791 m), weight 74 kg, SpO2 97 %.   General: No acute distress Head- Halo with pinsites mildy erythematous but no drainage or induration  Mood and affect are appropriate Heart: Regular rate and rhythm no rubs murmurs or extra sounds Lungs: Clear to auscultation, breathing unlabored, no rales or wheezes Abdomen: Positive bowel sounds, soft nontender to palpation, nondistended Extremities: No clubbing, cyanosis, or edema Skin: No evidence of breakdown, no evidence of rash Neurologic: Cranial nerves II through XII intact, motor strength is 5/5 in Right and 4/5 Left deltoid, bicep, tricep, grip, hip flexor, knee extensors, ankle dorsiflexor and plantar flexor Sensory exam normal sensation to light touch and proprioception in bilateral upper and lower extremities Cerebellar exam normal finger to nose to finger  as well as heel to shin in bilateral upper and lower extremities Musculoskeletal: Full range of motion in all 4 extremities. No joint swelling   Assessment/Plan: 1. 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  Physiatrist is providing close team supervision and 24 hour management of active medical problems listed below.  Physiatrist and rehab team continue to assess barriers to discharge/monitor patient progress toward functional and medical goals  Care Tool:  Bathing    Body parts bathed by patient: Right arm, Left arm, Buttocks, Right upper leg, Left upper leg, Right lower leg, Left lower leg, Face         Bathing assist Assist Level: Maximal Assistance - Patient 24 - 49%     Upper Body Dressing/Undressing Upper body dressing   What is the patient wearing?: Button up shirt    Upper body assist Assist Level: Total Assistance - Patient < 25%    Lower Body Dressing/Undressing Lower body dressing      What is the patient wearing?: Pants, Underwear/pull up     Lower body assist Assist for lower body dressing: Total Assistance - Patient < 25%     Toileting Toileting    Toileting assist Assist for toileting: 2 Helpers     Transfers Chair/bed transfer  Transfers assist           Locomotion Ambulation   Ambulation assist  Walk 10 feet activity   Assist           Walk 50 feet activity   Assist           Walk 150 feet activity   Assist           Walk 10 feet on uneven surface  activity   Assist           Wheelchair     Assist               Wheelchair 50 feet with 2 turns activity    Assist            Wheelchair 150 feet activity     Assist           Medical Problem List and Plan: 1.  Left-sided weakness and fine motor deficits,, bowel  incontinence, urinary retention secondary to cervical myelopathy status post decompression and stabilization with halo.  CIR evals PT, OT in am 2.  DVT Prophylaxis/Anticoagulation: Pharmaceutical: Lovenox 3. Pain Management: Tylenol prn--narcotics have been held due to encephalopathy with hallucinations.  4. Mood: LCSW to follow for evaluation and support.  5. Neuropsych: This patient is capable of making decisions on his own behalf. 6. Skin/Wound Care: Pin care bid. Monitor incision daily for healing. Maintain adequate nutritional and hydration status.  7. Fluids/Electrolytes/Nutrition: Monitor I/O. Check lytes in am.  8. Epidural abscess: Continue Ceftriaxone 2 grams every 12 hours with end date 12/24. Weekly CBC/BMET/CRP/Sed rate 9. Urinary retention/Neurogenic bladder: Continue proscar and flomax. Monitor voiding every 4-6 hours and cath to keep volumes < 350 cc. Will add urecholine also started flomax .4mg  Qhs 10. Constipation/Neurogenic bowel:  Had results with dig stim this am. KUB reviewed with significant stool burden.  Will schedule suppository daily in am. 11. Bipolar disorder: has not been taking care of himself for the past few year--started being compliant since mid summer. Team to provide ego support. Continue Zoloft and abilify daily. Atrax prn for anxiety. Limit xanax to bedtime to help with sleep.  12. ABLA:11/28 Hgb 9.4- will monitor 13. T2DM: Was not taking lantus PTA. Continue Lantus daily with novolog 8 units breakfast and 10 units bid. Will monitor BS ac/hs and use SSI for tighter control.  CBG (last 3)  Recent Labs    10/24/18 1642 10/24/18 2111 10/25/18 0729  GLUCAP 297* 122* 154*  controlled 11/29   14.  Hypoalbuminemia- start resource supplement 15.  Hx HTN- cont Cozaar- monitor othostatics Vitals:   10/24/18 1544 10/25/18 0425  BP: 109/68 108/75  Pulse: (!) 102 90  Resp: 16 18  Temp: 98 F (36.7 C) 98.5 F (36.9 C)  SpO2: 97% 97%   LOS: 2 days A FACE  TO FACE EVALUATION WAS PERFORMED  Erick Colacendrew E Kirsteins 10/25/2018, 8:11 AM

## 2018-10-25 NOTE — Progress Notes (Signed)
*  PRELIMINARY RESULTS* Vascular Ultrasound Bilateral lower extremity venous duplex has been completed.  Preliminary findings: No evidence of deep vein thrombosis or baker's cysts bilaterally.   Chauncey FischerCharlotte C Chiniqua Kilcrease 10/25/2018, 1:41 PM

## 2018-10-26 ENCOUNTER — Inpatient Hospital Stay (HOSPITAL_COMMUNITY): Payer: BLUE CROSS/BLUE SHIELD | Admitting: Physical Therapy

## 2018-10-26 ENCOUNTER — Inpatient Hospital Stay (HOSPITAL_COMMUNITY): Payer: BLUE CROSS/BLUE SHIELD | Admitting: Occupational Therapy

## 2018-10-26 DIAGNOSIS — E119 Type 2 diabetes mellitus without complications: Secondary | ICD-10-CM

## 2018-10-26 DIAGNOSIS — G062 Extradural and subdural abscess, unspecified: Secondary | ICD-10-CM

## 2018-10-26 DIAGNOSIS — G959 Disease of spinal cord, unspecified: Secondary | ICD-10-CM

## 2018-10-26 LAB — GLUCOSE, CAPILLARY
GLUCOSE-CAPILLARY: 171 mg/dL — AB (ref 70–99)
Glucose-Capillary: 190 mg/dL — ABNORMAL HIGH (ref 70–99)
Glucose-Capillary: 193 mg/dL — ABNORMAL HIGH (ref 70–99)
Glucose-Capillary: 66 mg/dL — ABNORMAL LOW (ref 70–99)
Glucose-Capillary: 81 mg/dL (ref 70–99)
Glucose-Capillary: 168 mg/dL — ABNORMAL HIGH (ref 70–99)

## 2018-10-26 NOTE — Progress Notes (Signed)
Hypoglycemic Event At 1915 CBG: 66  Treatment: Jonathan DittoGraham crackers and peanut butter  Symptoms: pt reported feeling hungry  Follow-up CBG: Time: 1930 CBG Result:81.  Possible Reasons for Event: unknown   Comments/MD notified: Dr Cato MulliganSwords notified. Ordered to continue current treatment.     Dior Stepter W Nashla Althoff

## 2018-10-26 NOTE — Progress Notes (Signed)
Physical Therapy Session Note  Patient Details  Name: Jonathan Horne MRN: [id number] Date of Birth: 02/26/1961  Today's Date: 10/26/2018 PT Individual Time: 0930-1000; 1430-1530 PT Individual Time Calculation (min): 30 min and 60 min  Short Term Goals: Week 1:  PT Short Term Goal 1 (Week 1): Pt will complete bed mobility with min A consistently PT Short Term Goal 2 (Week 1): Pt will complete least restrictive transfer with min A consistently PT Short Term Goal 3 (Week 1): Pt will ambulate x 25 ft with LRAD and min A  Skilled Therapeutic Interventions/Progress Updates:    Session 1: Pt received seated in bed, agreeable to PT. Pt reports some soreness in his head at pin sites, not rated and reports pain is improving. Supine to sit with min A for trunk control. Sit to stand with min A to RW. Pt is min A to don pants while standing EOB with RW for support. Pt has onset of dizziness. Returned to supine with min A. Supine BP 116/80, symptoms resolve with supine rest break. Pt returns to standing and reports incontinence of bowel. Standing balance with close CGA with RW while dependent pericare and brief change performed. Pt tolerates standing about 2 min before onset of fatigue. Encouraged pt to remain seated up in w/c until next therapy session. Pt left seated in high-back w/c with needs in reach, call button in reach.  Session 2: Pt received seated in bed finishing bladder scan and cath with RN, agreeable to PT. No complaints of pain. Supine to sit with min A with HOB elevated. Sit to stand with min A to RW. SPT bed to w/c with RW and min A. Ambulation 2 x 60 ft with RW and min A, occasional buckling of LLE with onset of fatigue. Ascend/descend 4 stairs with 2 handrails and min A, step-to gait pattern, intermittent buckling of LLE. Seated UBE x 3 min fwd, attempt 3 min backwards but pt has onset of dizziness. Seated BP 100/73. Assisted pt back to bed, min A for sit to supine. Supine BP 103/74. Symptoms  subside once in supine. Education with patient about OOB tolerance and effect on BP, encouraged fluids. Pt left seated in bed with needs in reach, bed alarm in place, daughter present.  Therapy Documentation Precautions:  Precautions Precautions: Cervical, Fall, Other (comment), Back Precaution Comments: HALO brace Restrictions Weight Bearing Restrictions: No   Therapy/Group: Individual Therapy  Peter Congoaylor Mirta Mally, PT, DPT  10/26/2018, 12:06 PM

## 2018-10-26 NOTE — Plan of Care (Signed)
  Problem: Consults Goal: RH SPINAL CORD INJURY PATIENT EDUCATION Description  See Patient Education module for education specifics.  Outcome: Progressing Goal: Skin Care Protocol Initiated - if Braden Score 18 or less Description If consults are not indicated, leave blank or document N/A Outcome: Progressing Goal: Nutrition Consult-if indicated Outcome: Progressing Goal: Diabetes Guidelines if Diabetic/Glucose > 140 Description If diabetic or lab glucose is > 140 mg/dl - Initiate Diabetes/Hyperglycemia Guidelines & Document Interventions  Outcome: Progressing   Problem: SCI BOWEL ELIMINATION Goal: RH STG MANAGE BOWEL WITH ASSISTANCE Description STG Manage Bowel with min Assistance.  Outcome: Progressing Goal: RH STG SCI MANAGE BOWEL WITH MEDICATION WITH ASSISTANCE Description STG SCI Manage bowel with medication with min assistance.  Outcome: Progressing Goal: RH STG MANAGE BOWEL W/EQUIPMENT W/ASSISTANCE Description STG Manage Bowel With Equipment With min Assistance  Outcome: Progressing Goal: RH STG SCI MANAGE BOWEL PROGRAM W/ASSIST OR AS APPROPRIATE Description STG SCI Manage bowel program mod w/assist or as appropriate.  Outcome: Progressing   Problem: SCI BLADDER ELIMINATION Goal: RH STG MANAGE BLADDER WITH ASSISTANCE Description STG Manage Bladder With mod Assistance  Outcome: Progressing Goal: RH STG MANAGE BLADDER WITH MEDICATION WITH ASSISTANCE Description STG Manage Bladder With Medication With mod  Assistance.  Outcome: Progressing Goal: RH STG MANAGE BLADDER WITH EQUIPMENT WITH ASSISTANCE Description STG Manage Bladder With Equipment With mod Assistance  Outcome: Progressing Goal: RH STG SCI MANAGE BLADDER PROGRAM W/ASSISTANCE Description Manage bladder program with mod assist   Outcome: Progressing   Problem: RH SKIN INTEGRITY Goal: RH STG SKIN FREE OF INFECTION/BREAKDOWN Outcome: Progressing Goal: RH STG MAINTAIN SKIN INTEGRITY WITH  ASSISTANCE Description STG Maintain Skin Integrity With Assistance. Outcome: Progressing Goal: RH STG ABLE TO PERFORM INCISION/WOUND CARE W/ASSISTANCE Description STG Able To Perform Incision/Wound Care With Assistance. Outcome: Progressing   Problem: RH SAFETY Goal: RH STG ADHERE TO SAFETY PRECAUTIONS W/ASSISTANCE/DEVICE Description STG Adhere to Safety Precautions With Assistance/Device. Outcome: Progressing Goal: RH STG DECREASED RISK OF FALL WITH ASSISTANCE Description STG Decreased Risk of Fall With Assistance. Outcome: Progressing   Problem: RH PAIN MANAGEMENT Goal: RH STG PAIN MANAGED AT OR BELOW PT'S PAIN GOAL Outcome: Progressing   Problem: RH KNOWLEDGE DEFICIT SCI Goal: RH STG INCREASE KNOWLEDGE OF SELF CARE AFTER SCI Outcome: Progressing

## 2018-10-26 NOTE — Progress Notes (Signed)
Occupational Therapy Session Note  Patient Details  Name: Jonathan MatteMark T Nelon MRN: [id number] Date of Birth: 10/12/1961  Today's Date: 10/26/2018 OT Individual Time: 1100-1139 OT Individual Time Calculation (min): 39 min    Short Term Goals: Week 1:  OT Short Term Goal 1 (Week 1): Pt will maintain standing balance with min A for LB clothing management.  OT Short Term Goal 2 (Week 1): Pt will don button down shirt with mod A to decrease level of assist for self care. OT Short Term Goal 3 (Week 1): Pt will tolerate standing for 5 minutes or more to increase tolerance for self care tasks and ambulation.   Skilled Therapeutic Interventions/Progress Updates: patient receiving IV and cath nursing care and brief change upon arrival for OT.     He complained of fatigued but concurred to transfer supine with head of bed elevated to edge of bed to complete mock underwear donning (preferred not to wear the pants he wore yesterday and stated he has had loose stools) with Min A.  He was assisted back to bed with CGA.   He preferred to eat his lunch which arrived supine in bed.   He was able to setup his tray.   Call bell and phone placed within his reach and alarm in place at this clinician's exit from the room.     Therapy Documentation Precautions:  Precautions Precautions: Cervical, Fall, Other (comment), Back Precaution Comments: HALO brace Restrictions Weight Bearing Restrictions: No General: General OT Amount of Missed Time: 4 Minutes(4)    Pain: denied  Therapy/Group: Individual Therapy  Bud Faceickett, [facility] 10/26/2018, 3:15 PM

## 2018-10-26 NOTE — Plan of Care (Signed)
  Problem: Consults Goal: RH SPINAL CORD INJURY PATIENT EDUCATION Description  See Patient Education module for education specifics.  Outcome: Progressing Goal: Skin Care Protocol Initiated - if Braden Score 18 or less Description If consults are not indicated, leave blank or document N/A Outcome: Progressing Goal: Nutrition Consult-if indicated Outcome: Progressing Goal: Diabetes Guidelines if Diabetic/Glucose > 140 Description If diabetic or lab glucose is > 140 mg/dl - Initiate Diabetes/Hyperglycemia Guidelines & Document Interventions  Outcome: Progressing   Problem: SCI BOWEL ELIMINATION Goal: RH STG MANAGE BOWEL WITH ASSISTANCE Description STG Manage Bowel with min Assistance.  Outcome: Progressing Goal: RH STG SCI MANAGE BOWEL WITH MEDICATION WITH ASSISTANCE Description STG SCI Manage bowel with medication with min assistance.  Outcome: Progressing Goal: RH STG MANAGE BOWEL W/EQUIPMENT W/ASSISTANCE Description STG Manage Bowel With Equipment With min Assistance  Outcome: Progressing Goal: RH STG SCI MANAGE BOWEL PROGRAM W/ASSIST OR AS APPROPRIATE Description STG SCI Manage bowel program mod w/assist or as appropriate.  Outcome: Progressing   Problem: SCI BLADDER ELIMINATION Goal: RH STG MANAGE BLADDER WITH ASSISTANCE Description STG Manage Bladder With mod Assistance  Outcome: Progressing Goal: RH STG MANAGE BLADDER WITH MEDICATION WITH ASSISTANCE Description STG Manage Bladder With Medication With mod  Assistance.  Outcome: Progressing Goal: RH STG MANAGE BLADDER WITH EQUIPMENT WITH ASSISTANCE Description STG Manage Bladder With Equipment With mod Assistance  Outcome: Progressing Goal: RH STG SCI MANAGE BLADDER PROGRAM W/ASSISTANCE Description Manage bladder program with mod assist   Outcome: Progressing   Problem: RH SKIN INTEGRITY Goal: RH STG SKIN FREE OF INFECTION/BREAKDOWN Outcome: Progressing Goal: RH STG MAINTAIN SKIN INTEGRITY WITH  ASSISTANCE Description STG Maintain Skin Integrity With Assistance. Outcome: Progressing Goal: RH STG ABLE TO PERFORM INCISION/WOUND CARE W/ASSISTANCE Description STG Able To Perform Incision/Wound Care With Assistance. Outcome: Progressing   Problem: RH SAFETY Goal: RH STG ADHERE TO SAFETY PRECAUTIONS W/ASSISTANCE/DEVICE Description STG Adhere to Safety Precautions With Assistance/Device. Outcome: Progressing Goal: RH STG DECREASED RISK OF FALL WITH ASSISTANCE Description STG Decreased Risk of Fall With Assistance. Outcome: Progressing   Problem: RH PAIN MANAGEMENT Goal: RH STG PAIN MANAGED AT OR BELOW PT'S PAIN GOAL Outcome: Progressing   Problem: RH KNOWLEDGE DEFICIT SCI Goal: RH STG INCREASE KNOWLEDGE OF SELF CARE AFTER SCI Outcome: Progressing   

## 2018-10-26 NOTE — Progress Notes (Signed)
Occupational Therapy Session Note  Patient Details  Name: Jonathan Horne MRN: [id number] Date of Birth: 02/16/1961  Today's Date: 10/26/2018 OT Individual Time: [id number] OT Individual Time Calculation (min): 43 min    Short Term Goals: Week 1:  OT Short Term Goal 1 (Week 1): Pt will maintain standing balance with min A for LB clothing management.  OT Short Term Goal 2 (Week 1): Pt will don button down shirt with mod A to decrease level of assist for self care. OT Short Term Goal 3 (Week 1): Pt will tolerate standing for 5 minutes or more to increase tolerance for self care tasks and ambulation.   Skilled Therapeutic Interventions/Progress Updates: Patient receiving medications and nursing care and IV "clogged" upon arrival.    RN contacted IV nurse who was already on her way to patient room for port service.   She was able to clear "clog" in IV. Patient transferred sitting upright in bed with head of bed elevated to edge of bed with cues for technique and extra time.  Sitting edge of bed, Patient washed under both arms and donned deodorant with setup assist.  Patient attempted to don a shirt overhead, but even with vertical slits cut in front neck hole, shoulders at neck hole, and back, shirt was too tight to don over halo brace.   Patient required min assist to don gown due to his hesitancy to disturb pic line.    He stated he will have his wife bring larger button front and large, oversized v neck shirts.   Due to time constraints and other necessary nurusing care, patient did not have time to participate in lower body bathing or dressing as planned.  He transferred edge of bed back to supine in bed with close S and cues.     He was left in the care of his RN and IV RN.   Therapy Documentation Precautions:  Precautions Precautions: Cervical, Fall, Other (comment), Back Precaution Comments: HALO brace Restrictions Weight Bearing Restrictions: No  Pain:denied   Therapy/Group:  Individual Therapy  Bud Faceickett, [facility] 10/26/2018, 9:41 AM

## 2018-10-26 NOTE — Progress Notes (Signed)
Patient ID: Jonathan Horne, male   DOB: 05-04-1961, 57 y.o.   MRN: [id number]   [name] is a 57 y.o. 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  He is status post decompression and stabilization with halo  Past Medical History:  Diagnosis Date  . ADHD   . Bipolar 1 disorder (HCC)   . Chronic anemia   . Diabetes mellitus (HCC)   . Encephalopathy due to infection 09/2018  . History of alcohol abuse   . HTN (hypertension)   . Insomnia   . Meningitis due to bacteria   . Rash    chronic  . Ulnar neuropathy      Subjective: No new complaints. No new problems.  Reasonable night.  Ambulatory with PT  Objective: Vital signs in last 24 hours: Temp:  [97.9 F (36.6 C)-98.8 F (37.1 C)] 98.7 F (37.1 C) (11/30 0639) Pulse Rate:  [96-101] 98 (11/30 0707) Resp:  [18-20] 18 (11/30 0639) BP: (110-119)/(72-81) 117/81 (11/30 0707) SpO2:  [97 %-100 %] 97 % (11/30 0707) Weight change:  Last BM Date: 10/25/18  Intake/Output from previous day: 11/29 0701 - 11/30 0700 In: 700 [P.O.:700] Out: 2200 [Urine:2200] Last cbgs: CBG (last 3)  Recent Labs    10/25/18 2204 10/26/18 0710 10/26/18 0812  GLUCAP 138* 171* 190*   Lab Results  Component Value Date   HGBA1C 8.8 (H) 10/24/2018    Patient Vitals for the past 24 hrs:  BP Temp Pulse Resp SpO2  10/26/18 0707 117/81 - 98 - 97 %  10/26/18 0705 116/76 - 97 - 97 %  10/26/18 0639 112/80 98.7 F (37.1 C) 99 18 99 %  10/26/18 0421 110/80 98.8 F (37.1 C) (!) 101 20 99 %  10/25/18 2107 119/72 97.9 F (36.6 C) 96 18 100 %     Physical Exam General: No apparent distress   HEENT: halo in place Lungs: Normal effort. Lungs clear to auscultation, no crackles or wheezes. Cardiovascular: Regular rate and rhythm, no edema Abdomen: S/NT/ND; BS(+) Musculoskeletal:  unchanged Neurological: No new neurological deficits with left-sided weakness Extremities-no edema Skin:  clear   Mental state: Alert, oriented, cooperative    Lab Results: BMET    Component Value Date/Time   NA 137 10/24/2018 0500   K 3.8 10/24/2018 0500   CL 102 10/24/2018 0500   CO2 27 10/24/2018 0500   GLUCOSE 178 (H) 10/24/2018 0500   BUN 13 10/24/2018 0500   CREATININE 0.71 10/24/2018 0500   CALCIUM 8.3 (L) 10/24/2018 0500   GFRNONAA >60 10/24/2018 0500   GFRAA >60 10/24/2018 0500   CBC    Component Value Date/Time   WBC 6.2 10/24/2018 0500   RBC 3.72 (L) 10/24/2018 0500   HGB 9.4 (L) 10/24/2018 0500   HCT 31.8 (L) 10/24/2018 0500   PLT 488 (H) 10/24/2018 0500   MCV 85.5 10/24/2018 0500   MCH 25.3 (L) 10/24/2018 0500   MCHC 29.6 (L) 10/24/2018 0500   RDW 19.8 (H) 10/24/2018 0500   LYMPHSABS 1.5 10/24/2018 0500   MONOABS 0.5 10/24/2018 0500   EOSABS 0.1 10/24/2018 0500   BASOSABS 0.0 10/24/2018 0500    Studies/Results: Vas [place] Lower Extremity Venous (dvt)  Result Date: 10/25/2018  Lower Venous Study Performing Technologist: Levada Schilling RDMS, RVT  Examination Guidelines: A complete evaluation includes B-mode imaging, spectral Doppler, color Doppler, and power Doppler as needed of all accessible portions of each vessel. Bilateral testing is  considered an integral part of a complete examination. Limited examinations for reoccurring indications may be performed as noted.  Right Venous Findings: +---------+---------------+---------+-----------+----------+-------+          CompressibilityPhasicitySpontaneityPropertiesSummary +---------+---------------+---------+-----------+----------+-------+ CFV      Full           Yes      Yes                          +---------+---------------+---------+-----------+----------+-------+ SFJ      Full                                                 +---------+---------------+---------+-----------+----------+-------+ FV Prox  Full                                                  +---------+---------------+---------+-----------+----------+-------+ FV Mid   Full                                                 +---------+---------------+---------+-----------+----------+-------+ FV DistalFull                                                 +---------+---------------+---------+-----------+----------+-------+ PFV      Full                                                 +---------+---------------+---------+-----------+----------+-------+ POP      Full           Yes      Yes                          +---------+---------------+---------+-----------+----------+-------+ PTV      Full                                                 +---------+---------------+---------+-----------+----------+-------+ PERO     Full                                                 +---------+---------------+---------+-----------+----------+-------+ GSV      Full                                                 +---------+---------------+---------+-----------+----------+-------+  Left Venous Findings: +---------+---------------+---------+-----------+----------+-------+          CompressibilityPhasicitySpontaneityPropertiesSummary +---------+---------------+---------+-----------+----------+-------+ CFV      Full  Yes      Yes                          +---------+---------------+---------+-----------+----------+-------+ SFJ      Full                                                 +---------+---------------+---------+-----------+----------+-------+ FV Prox  Full                                                 +---------+---------------+---------+-----------+----------+-------+ FV Mid   Full                                                 +---------+---------------+---------+-----------+----------+-------+ FV DistalFull                                                  +---------+---------------+---------+-----------+----------+-------+ PFV      Full                                                 +---------+---------------+---------+-----------+----------+-------+ POP      Full           Yes      Yes                          +---------+---------------+---------+-----------+----------+-------+ PTV      Full                                                 +---------+---------------+---------+-----------+----------+-------+ PERO     Full                                                 +---------+---------------+---------+-----------+----------+-------+ GSV      Full                                                 +---------+---------------+---------+-----------+----------+-------+    Summary: Right: There is no evidence of deep vein thrombosis in the lower extremity. No cystic structure found in the popliteal fossa. Left: There is no evidence of deep vein thrombosis in the lower extremity. No cystic structure found in the popliteal fossa.  *See table(s) above for measurements and observations. Electronically signed by Lemar Livings MD on 10/25/2018 at 3:37:02 PM.    Final     Medications: I have reviewed the patient's  current medications.  Assessment/Plan:  Cervical myelopathy-status post decompression and stabilization.  Continue CIR Status post epidural abscess.  Continue Rocephin DVT prophylaxis.  Continue Lovenox Neurogenic bladder/urinary retention.  Continue Proscar Flomax and Urecholine T2 DM.  Reasonable glycemic control.  Continue basal bolus insulin Essential hypertension.  Well-controlled   Length of stay, days: 3  Gordy Savers , MD 10/26/2018, 8:58 AM

## 2018-10-27 ENCOUNTER — Inpatient Hospital Stay (HOSPITAL_COMMUNITY): Payer: BLUE CROSS/BLUE SHIELD

## 2018-10-27 LAB — GLUCOSE, CAPILLARY
Glucose-Capillary: 160 mg/dL — ABNORMAL HIGH (ref 70–99)
Glucose-Capillary: 152 mg/dL — ABNORMAL HIGH (ref 70–99)
Glucose-Capillary: 143 mg/dL — ABNORMAL HIGH (ref 70–99)

## 2018-10-27 NOTE — Progress Notes (Signed)
Spoke with Dr Amador CunasKwiatkowski regarding pt being sympathic with orthostatic hypotension. Therapy limited due to patient BP and c/o dizziness. Pt has TEDs with ace wraps on. Dr. Kirtland BouchardK encouraged to let pt sit on side of bed and dangle legs prior to getting up and also DC losartan. Pt was updated with information. Will cont to monitor.   Ross LudwigKAYLA M Jagjit Riner, LPN

## 2018-10-27 NOTE — Progress Notes (Signed)
Patient ID: Jonathan MatteMark T Mui, male   DOB: 12/23/1960, 57 y.o.   MRN: [id number]   Jonathan Horne is a 57 y.o. male who is admitted for CIR with functional deficits following decompression and stabilization with halo from a cervical myelopathy related to an epidural abscess.   Subjective: No new complaints.  He states that he slept better last night but is requesting some melatonin.  He also states that he does not tolerate tramadol and wishes a alternate analgesic  Past Medical History:  Diagnosis Date  . ADHD   . Bipolar 1 disorder (HCC)   . Chronic anemia   . Diabetes mellitus (HCC)   . Encephalopathy due to infection 09/2018  . History of alcohol abuse   . HTN (hypertension)   . Insomnia   . Meningitis due to bacteria   . Rash    chronic  . Ulnar neuropathy    Patient Vitals for the past 24 hrs:  BP Temp Pulse Resp SpO2  10/27/18 0520 99/72 - (!) 121 - -  10/27/18 0515 107/77 - 95 - 100 %  10/27/18 0513 107/75 98 F (36.7 C) 93 18 100 %  10/26/18 2030 114/75 98.1 F (36.7 C) (!) 106 16 96 %  10/26/18 1629 105/74 98 F (36.7 C) 94 - 99 %     Objective: Vital signs in last 24 hours: Temp:  [98 F (36.7 C)-98.1 F (36.7 C)] 98 F (36.7 C) (12/01 0513) Pulse Rate:  [93-121] 121 (12/01 0520) Resp:  [16-18] 18 (12/01 0513) BP: (99-114)/(72-77) 99/72 (12/01 0520) SpO2:  [96 %-100 %] 100 % (12/01 0515) Weight change:  Last BM Date: 10/26/18  Intake/Output from previous day: 11/30 0701 - 12/01 0700 In: 1651.9 [P.O.:1042; IV Piggyback:609.9] Out: 3831 [Urine:3831] Last cbgs: CBG (last 3)  Recent Labs    10/26/18 1929 10/26/18 2153 10/27/18 0632  GLUCAP 81 168* 152*     Physical Exam General: No apparent distress   HEENT: Halo in place Lungs: Normal effort. Lungs clear to auscultation, no crackles or wheezes. Cardiovascular: Regular rate and rhythm, no edema Abdomen: S/NT/ND; BS(+) Musculoskeletal:  unchanged Neurological: No new neurological deficits with  left-sided weakness Extremities.  No edema Skin: clear   Mental state: Alert, oriented, cooperative    Lab Results: BMET    Component Value Date/Time   NA 137 10/24/2018 0500   K 3.8 10/24/2018 0500   CL 102 10/24/2018 0500   CO2 27 10/24/2018 0500   GLUCOSE 178 (H) 10/24/2018 0500   BUN 13 10/24/2018 0500   CREATININE 0.71 10/24/2018 0500   CALCIUM 8.3 (L) 10/24/2018 0500   GFRNONAA >60 10/24/2018 0500   GFRAA >60 10/24/2018 0500   CBC    Component Value Date/Time   WBC 6.2 10/24/2018 0500   RBC 3.72 (L) 10/24/2018 0500   HGB 9.4 (L) 10/24/2018 0500   HCT 31.8 (L) 10/24/2018 0500   PLT 488 (H) 10/24/2018 0500   MCV 85.5 10/24/2018 0500   MCH 25.3 (L) 10/24/2018 0500   MCHC 29.6 (L) 10/24/2018 0500   RDW 19.8 (H) 10/24/2018 0500   LYMPHSABS 1.5 10/24/2018 0500   MONOABS 0.5 10/24/2018 0500   EOSABS 0.1 10/24/2018 0500   BASOSABS 0.0 10/24/2018 0500    Studies/Results: Vas [place]as Lower Extremity Venous (dvt)  Result Date: 10/25/2018  Lower Venous Study Performing Technologist: Levada Schillingharlotte Bynum RDMS, RVT  Examination Guidelines: A complete evaluation includes B-mode imaging, spectral Doppler, color Doppler, and power Doppler as needed of all accessible  portions of each vessel. Bilateral testing is considered an integral part of a complete examination. Limited examinations for reoccurring indications may be performed as noted.  Right Venous Findings: +---------+---------------+---------+-----------+----------+-------+          CompressibilityPhasicitySpontaneityPropertiesSummary +---------+---------------+---------+-----------+----------+-------+ CFV      Full           Yes      Yes                          +---------+---------------+---------+-----------+----------+-------+ SFJ      Full                                                 +---------+---------------+---------+-----------+----------+-------+ FV Prox  Full                                                  +---------+---------------+---------+-----------+----------+-------+ FV Mid   Full                                                 +---------+---------------+---------+-----------+----------+-------+ FV DistalFull                                                 +---------+---------------+---------+-----------+----------+-------+ PFV      Full                                                 +---------+---------------+---------+-----------+----------+-------+ POP      Full           Yes      Yes                          +---------+---------------+---------+-----------+----------+-------+ PTV      Full                                                 +---------+---------------+---------+-----------+----------+-------+ PERO     Full                                                 +---------+---------------+---------+-----------+----------+-------+ GSV      Full                                                 +---------+---------------+---------+-----------+----------+-------+  Left Venous Findings: +---------+---------------+---------+-----------+----------+-------+          CompressibilityPhasicitySpontaneityPropertiesSummary +---------+---------------+---------+-----------+----------+-------+ CFV  Full           Yes      Yes                          +---------+---------------+---------+-----------+----------+-------+ SFJ      Full                                                 +---------+---------------+---------+-----------+----------+-------+ FV Prox  Full                                                 +---------+---------------+---------+-----------+----------+-------+ FV Mid   Full                                                 +---------+---------------+---------+-----------+----------+-------+ FV DistalFull                                                  +---------+---------------+---------+-----------+----------+-------+ PFV      Full                                                 +---------+---------------+---------+-----------+----------+-------+ POP      Full           Yes      Yes                          +---------+---------------+---------+-----------+----------+-------+ PTV      Full                                                 +---------+---------------+---------+-----------+----------+-------+ PERO     Full                                                 +---------+---------------+---------+-----------+----------+-------+ GSV      Full                                                 +---------+---------------+---------+-----------+----------+-------+    Summary: Right: There is no evidence of deep vein thrombosis in the lower extremity. No cystic structure found in the popliteal fossa. Left: There is no evidence of deep vein thrombosis in the lower extremity. No cystic structure found in the popliteal fossa.  *See table(s) above for measurements and observations. Electronically signed by Lemar Livings MD on 10/25/2018 at 3:37:02 PM.  Final     Medications: I have reviewed the patient's current medications.  Assessment/Plan:  Cervical myelopathy.  Status post decompression and stabilization.  Continue CIR DVT prophylaxis.  Continue Lovenox T2DM.  Continue basal bolus insulin. Insomnia.  Continue trazodone Pain control.  Will DC tramadol due to intolerance and try acetaminophen with hydrocodone Hypertension stable.  Blood pressure has been running in a low normal range.  Remains on low-dose losartan    Length of stay, days: 4  Gordy Savers , MD 10/27/2018, 9:06 AM

## 2018-10-27 NOTE — Progress Notes (Addendum)
Occupational Therapy Session Note  Patient Details  Name: Jonathan Horne MRN: [id number] Date of Birth: 1961-11-26  Today's Date: 10/27/2018 OT Individual Time: 1430-1510 OT Individual Time Calculation (min): 40 min   Missed time 35 min d/t pt fatigue and orthostatic BP. Will follow up as available.    Short Term Goals: Week 1:  OT Short Term Goal 1 (Week 1): Pt will maintain standing balance with min A for LB clothing management.  OT Short Term Goal 2 (Week 1): Pt will don button down shirt with mod A to decrease level of assist for self care. OT Short Term Goal 3 (Week 1): Pt will tolerate standing for 5 minutes or more to increase tolerance for self care tasks and ambulation.   Skilled Therapeutic Interventions/Progress Updates:    1:1. Pt very pleasant with pain in head/ however already received tylenol. Pt symptomatic with orthostatic hypertension in previous session and pt feels fatigued. Pt willing to try tx after OT dons thigh high teds and ace wraps. Educated pt on use of abdominal binder, dangling LE prior to mobility as well as use of light exercise to increase BP prior to mobility. Pt receptive and verbalizes understanding. Pt supine>sitting with CGA and BP as follows: supine-108/76 EOB-98/72. Pt was going to attempt donning/doffing socks however reports too dizzy and requesting to lay back down, terminate session and rest. Pt was educated on use of tall mirror to aide in dressing as pt with limited vision d/t HALO. Exited session with tp seated in bed, call light in reach and all needs met.  Therapy Documentation Precautions:  Precautions Precautions: Cervical, Fall, Other (comment), Back Precaution Comments: HALO brace Restrictions Weight Bearing Restrictions: No General: General OT Amount of Missed Time: 35 Minutes Vital Signs: Therapy Vitals Temp: 98.4 F (36.9 C) Temp Source: Oral Pulse Rate: (!) 103 Resp: 18 BP: 111/76 Patient Position (if appropriate):  Lying Oxygen Therapy SpO2: 99 % O2 Device: Room Air Pain: Pain Assessment Pain Scale: 0-10 Pain Score: 6  Pain Type: Acute pain Pain Location: Head Pain Orientation: Posterior Pain Radiating Towards: neck Pain Descriptors / Indicators: Aching Pain Frequency: Intermittent Pain Onset: On-going Patients Stated Pain Goal: 3 Pain Intervention(s): Medication (See eMAR) ADL:   Vision   Perception    Praxis   Exercises:   Other Treatments:     Therapy/Group: Individual Therapy  Tonny Branch 10/27/2018, 3:14 PM

## 2018-10-27 NOTE — Progress Notes (Signed)
Physical Therapy Session Note  Patient Details  Name: Jonathan Horne MRN: [id number] Date of Birth: 05/09/1961  Today's Date: 10/27/2018 PT Individual Time: 1000-1058 and 1300-1355 PT Individual Time Calculation (min): 58 min and 55 minutes   Short Term Goals: Week 1:  PT Short Term Goal 1 (Week 1): Pt will complete bed mobility with min A consistently PT Short Term Goal 2 (Week 1): Pt will complete least restrictive transfer with min A consistently PT Short Term Goal 3 (Week 1): Pt will ambulate x 25 ft with LRAD and min A  Skilled Therapeutic Interventions/Progress Updates:    Session 1: Pt supine in bed asleep upon PT arrival, agreeable to therapy tx and reports pain 5/10 in cervical region. Pt donned pants in supine with assist to loop LEs through. Pt transferred from supine>sitting EOB with supervision, reports some dizziness. Therapist dons teds total assist. Pt performed sit<>stand with min assist and stand pivot to w/c with RW and min assist, pt reports dizziness. Pt transported to the gym. Therapist assessed blood pressure in sitting 109/79 and in standing 94/72, pt symptomatic. Therapist applied ace wraps over teds for blood pressure control, educated pt to keep teds on all day and remove at night. Pt performed sit<>stands with RW this session min assist, worked on standing balance activity tossing horseshoes, CGA x 2 trials. Pt transported back to room and stand pivot to bed with RW min assist. Pt left supine with needs in reach, bed alarm set.   Session 2: Pt supine in bed upon PT arrival, agreeable to therapy tx and reports pain 5/10 in posterior cervical region. Pt transferred supine>sitting EOB with min assist, pt reports dizziness with BP 118/79 sitting. Teds and ace wraps already donned. Pt sits EOB x 2 minutes, symptoms start to resolve. Pt performs sit<>stand with RW and min assist, BP in standing 100/66 with pt symptomatic. Pt reports he needs to lay down, therapist assist pt to  sidelying in bed with min assist. Therapist reports orthostatic findings to RN. Pt ready to try sitting up again, min assist. Pt now maintains sitting balance at EOB x 6 minutes working on upright activity tolerance while eating a salad, BP 107/71 in sitting. Pt requests to lay back down. Pt agreeable to try sitting up again, sits EOB x 3 minutes and reports diminished symptoms. Pt performs sit<>stand with RW and min assist, requests to sit back down after about 10 sec. Pt transferred to supine with supervision, left with needs in reach. Pt encouraged to drink more water throughout the day.     Therapy Documentation Precautions:  Precautions Precautions: Cervical, Fall, Other (comment), Back Precaution Comments: HALO brace Restrictions Weight Bearing Restrictions: No    Therapy/Group: Individual Therapy  Cresenciano GenreEmily van Schagen, PT, DPT 10/27/2018, 7:56 AM

## 2018-10-27 NOTE — Progress Notes (Signed)
At 2210, bladder scan=271. Reports poor night sleep, last night, PRN xanax given and PRN tylenol given for C/O HA. At 0010, bladder scan=671, cath=700cc's, also small incontinent stool. . At 0540, bladder scan=429, cath=500cc's and small incontinent BM. Scheduled supp.given.Jonathan MartinezMurray, Jonathan Horne A

## 2018-10-27 NOTE — IPOC Note (Signed)
Overall Plan of Care [facility](IPOC) Patient Details Name: Jonathan MatteMark T Horne MRN: [id number] DOB: 06/26/1961  Admitting Diagnosis: <principal problem not specified>  Hospital Problems: Active Problems:   Cervical myelopathy (HCC)   Constipation   Encephalopathy   Neurogenic bladder   Epidural abscess   Neurogenic bowel   Bipolar disorder (HCC)   Acute blood loss anemia   Diabetes mellitus type 2 in nonobese [facility](HCC)     Functional Problem List: Nursing Bladder, Bowel, Endurance, Medication Management, Pain, Perception, Skin Integrity, Safety  PT Balance, Endurance, Pain, Perception, Safety  OT Balance, Endurance, Motor, Pain, Safety  SLP    TR         Basic ADL's: OT Grooming, Bathing, Dressing, Toileting     Advanced  ADL's: OT Simple Meal Preparation     Transfers: PT Bed Mobility, Bed to Chair, Car, State Street CorporationFurniture, Floor  OT Toilet     Locomotion: PT Ambulation, Psychologist, prison and probation servicesWheelchair Mobility, Stairs     Additional Impairments: OT None  SLP        TR      Anticipated Outcomes Item Anticipated Outcome  Self Feeding    Swallowing      Basic self-care  supervision  Toileting  supervision   Bathroom Transfers supervision  Bowel/Bladder  continent of bowel and bladder with min assist  Transfers  Supervision  Locomotion  Supervision with LRAD  Communication     Cognition     Pain  Pain lesss than or equal to 4/10 with min assist  Safety/Judgment  free from falls/injury and displaying sound safety judgement   Therapy Plan: PT Intensity: Minimum of 1-2 x/day ,45 to 90 minutes PT Frequency: 5 out of 7 days PT Duration Estimated Length of Stay: 14-17 days OT Intensity: Minimum of 1-2 x/day, 45 to 90 minutes OT Frequency: 5 out of 7 days OT Duration/Estimated Length of Stay: 14-17 days      Team Interventions: Nursing Interventions Patient/Family Education, Bladder Management, Disease Management/Prevention, Pain Management, Medication Management, Skin Care/Wound Management,  Discharge Planning  PT interventions Ambulation/gait training, Balance/vestibular training, Community reintegration, Discharge planning, Disease management/prevention, DME/adaptive equipment instruction, Functional mobility training, Neuromuscular re-education, Pain management, Patient/family education, Psychosocial support, Stair training, Therapeutic Exercise, Therapeutic Activities, UE/LE Strength taining/ROM, UE/LE Coordination activities, Wheelchair propulsion/positioning  OT Interventions Warden/rangerBalance/vestibular training, Fish farm managerDME/adaptive equipment instruction, Patient/family education, Therapeutic Activities, Cognitive remediation/compensation, Psychosocial support, Therapeutic Exercise, Community reintegration, Functional mobility training, Self Care/advanced ADL retraining, Discharge planning, UE/LE Strength taining/ROM, UE/LE Coordination activities, Pain management  SLP Interventions    TR Interventions    SW/CM Interventions Discharge Planning, Psychosocial Support, Patient/Family Education   Barriers to Discharge MD  Medical stability  Nursing Incontinence    PT Medical stability, Home environment access/layout    OT Other (comments) none known at this time  SLP      SW       Team Discharge Planning: Destination: PT-Home ,OT- Home , SLP-  Projected Follow-up: PT-Home health PT, OT-  Home health OT, 24 hour supervision/assistance, SLP-  Projected Equipment Needs: PT-Rolling walker with 5" wheels, To be determined, OT- To be determined, SLP-  Equipment Details: PT-RW, OT-  Patient/family involved in discharge planning: PT- Patient,  OT-Patient, SLP-   MD ELOS: 14-17 days Medical Rehab Prognosis:  Excellent Assessment: The patient has been admitted for CIR therapies with the diagnosis of cervical myelopathy. The team will be addressing functional mobility, strength, stamina, balance, safety, adaptive techniques and equipment, self-care, bowel and bladder mgt, patient and caregiver  education, NMR, pain  control, community reentry, ego support. Goals have been set at supervision for self-care, ADL's and mobilty.    Ranelle Oyster, MD, FAAPMR      See Team Conference Notes for weekly updates to the plan of care

## 2018-10-28 ENCOUNTER — Inpatient Hospital Stay (HOSPITAL_COMMUNITY): Payer: BLUE CROSS/BLUE SHIELD | Admitting: Occupational Therapy

## 2018-10-28 ENCOUNTER — Inpatient Hospital Stay (HOSPITAL_COMMUNITY): Payer: BLUE CROSS/BLUE SHIELD

## 2018-10-28 ENCOUNTER — Encounter (HOSPITAL_COMMUNITY): Payer: BLUE CROSS/BLUE SHIELD | Admitting: Psychology

## 2018-10-28 DIAGNOSIS — F3170 Bipolar disorder, currently in remission, most recent episode unspecified: Secondary | ICD-10-CM

## 2018-10-28 LAB — CBC WITH DIFFERENTIAL/PLATELET
Abs Immature Granulocytes: 0.03 10*3/uL (ref 0.00–0.07)
BASOS ABS: 0 10*3/uL (ref 0.0–0.1)
Basophils Relative: 1 %
Eosinophils Absolute: 0.1 10*3/uL (ref 0.0–0.5)
Eosinophils Relative: 2 %
HCT: 32.6 % — ABNORMAL LOW (ref 39.0–52.0)
HEMOGLOBIN: 9.8 g/dL — AB (ref 13.0–17.0)
Immature Granulocytes: 1 %
LYMPHS PCT: 26 %
Lymphs Abs: 1.6 10*3/uL (ref 0.7–4.0)
MCH: 25.7 pg — ABNORMAL LOW (ref 26.0–34.0)
MCHC: 30.1 g/dL (ref 30.0–36.0)
MCV: 85.3 fL (ref 80.0–100.0)
Monocytes Absolute: 0.6 10*3/uL (ref 0.1–1.0)
Monocytes Relative: 9 %
Neutro Abs: 3.7 10*3/uL (ref 1.7–7.7)
Neutrophils Relative %: 61 %
Platelets: 430 10*3/uL — ABNORMAL HIGH (ref 150–400)
RBC: 3.82 MIL/uL — AB (ref 4.22–5.81)
RDW: 19.7 % — ABNORMAL HIGH (ref 11.5–15.5)
WBC: 6.1 10*3/uL (ref 4.0–10.5)
nRBC: 0 % (ref 0.0–0.2)

## 2018-10-28 LAB — BASIC METABOLIC PANEL
Anion gap: 5 (ref 5–15)
BUN: 16 mg/dL (ref 6–20)
CO2: 33 mmol/L — ABNORMAL HIGH (ref 22–32)
Calcium: 8.5 mg/dL — ABNORMAL LOW (ref 8.9–10.3)
Chloride: 100 mmol/L (ref 98–111)
Creatinine, Ser: 0.62 mg/dL (ref 0.61–1.24)
GFR calc Af Amer: >60 mL/min (ref >60)
GFR calc non Af Amer: >60 mL/min (ref >60)
Glucose, Bld: 166 mg/dL — ABNORMAL HIGH (ref 70–99)
Potassium: 3.9 mmol/L (ref 3.5–5.1)
Sodium: 138 mmol/L (ref 135–145)

## 2018-10-28 LAB — GLUCOSE, CAPILLARY
Glucose-Capillary: 147 mg/dL — ABNORMAL HIGH (ref 70–99)
Glucose-Capillary: 146 mg/dL — ABNORMAL HIGH (ref 70–99)
Glucose-Capillary: 171 mg/dL — ABNORMAL HIGH (ref 70–99)
Glucose-Capillary: 150 mg/dL — ABNORMAL HIGH (ref 70–99)
Glucose-Capillary: 152 mg/dL — ABNORMAL HIGH (ref 70–99)
Glucose-Capillary: 124 mg/dL — ABNORMAL HIGH (ref 70–99)
Glucose-Capillary: 161 mg/dL — ABNORMAL HIGH (ref 70–99)

## 2018-10-28 LAB — C-REACTIVE PROTEIN: CRP: <0.8 mg/dL (ref <1.0)

## 2018-10-28 LAB — SEDIMENTATION RATE: SED RATE: 68 mm/h — AB (ref 0–16)

## 2018-10-28 MED ORDER — CHLORHEXIDINE GLUCONATE CLOTH 2 % EX PADS
6.0000 | MEDICATED_PAD | Freq: Every day | CUTANEOUS | Status: DC
  Administered 2018-10-28 – 2018-11-06 (×10): 6 via TOPICAL

## 2018-10-28 NOTE — Progress Notes (Signed)
[place] PHYSICAL MEDICINE & REHABILITATION PROGRESS NOTE   Subjective/Complaints: No new complaints. Pain is controlled. No sense of bladder emptying. Does have feeling with bowels at times, bowel program  ROS: Patient denies fever, rash, sore throat, blurred vision, nausea, vomiting, diarrhea, cough, shortness of breath or chest pain, joint or back pain, headache, or mood change.   Objective:   No results found. Recent Labs    10/28/18 0320  WBC 6.1  HGB 9.8*  HCT 32.6*  PLT 430*   Recent Labs    10/28/18 0320  NA 138  K 3.9  CL 100  CO2 33*  GLUCOSE 166*  BUN 16  CREATININE 0.62  CALCIUM 8.5*    Intake/Output Summary (Last 24 hours) at 10/28/2018 0846 Last data filed at 10/28/2018 0217 Gross per 24 hour  Intake 1060 ml  Output 2500 ml  Net -1440 ml     Physical Exam: Vital Signs Blood pressure 119/79, pulse (!) 107, temperature 98.1 F (36.7 C), resp. rate 17, height 5' 10.5" (1.791 m), weight 74 kg, SpO2 100 %.   Constitutional: No distress . Vital signs reviewed. HEENT: EOMI, oral membranes moist, Halo in place, rod sites CDI Neck: supple Cardiovascular: RRR without murmur. No JVD    Respiratory: CTA Bilaterally without wheezes or rales. Normal effort    GI: BS +, non-tender, non-distended  Skin: No evidence of breakdown, no evidence of rash Neurologic: Cranial nerves II through XII intact, motor strength is 5/5 in Right and 4/5 Left deltoid, bicep, tricep, grip, hip flexor, knee extensors, ankle dorsiflexor and plantar flexor---stable Senses LT and PP in all 4's. Normal cognition  Musculoskeletal: full ROM   Assessment/Plan: 1. 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  Physiatrist is providing close team supervision and 24 hour management of active medical problems listed below.  Physiatrist and rehab team  continue to assess barriers to discharge/monitor patient progress toward functional and medical goals  Care Tool:  Bathing  Bathing activity did not occur: Refused Body parts bathed by patient: Right arm, Left arm, Buttocks, Right upper leg, Left upper leg, Right lower leg, Left lower leg, Face         Bathing assist Assist Level: Maximal Assistance - Patient 24 - 49%     Upper Body Dressing/Undressing Upper body dressing   What is the patient wearing?: Button up shirt    Upper body assist Assist Level: Total Assistance - Patient < 25%    Lower Body Dressing/Undressing Lower body dressing    Lower body dressing activity did not occur: Refused What is the patient wearing?: Pants, Underwear/pull up     Lower body assist Assist for lower body dressing: Total Assistance - Patient < 25%     Toileting Toileting Toileting Activity did not occur Press photographer(Clothing management and hygiene only): N/A (no void or bm)  Toileting assist Assist for toileting: Dependent - Patient 0%     Transfers Chair/bed transfer  Transfers assist     Chair/bed transfer assist level: Minimal Assistance - Patient > 75%     Locomotion Ambulation   Ambulation assist      Assist level: Minimal Assistance - Patient > 75% Assistive device: Walker-rolling Max distance: 60'   Walk 10 feet activity   Assist  Walk 10 feet activity did not occur: Safety/medical concerns  Assist level: Minimal Assistance - Patient > 75% Assistive device: Walker-rolling  Walk 50 feet activity   Assist Walk 50 feet with 2 turns activity did not occur: Safety/medical concerns  Assist level: Minimal Assistance - Patient > 75% Assistive device: Walker-rolling    Walk 150 feet activity   Assist Walk 150 feet activity did not occur: Safety/medical concerns         Walk 10 feet on uneven surface  activity   Assist Walk 10 feet on uneven surfaces activity did not occur: Safety/medical concerns          Wheelchair     Assist Will patient use wheelchair at discharge?: (TBD) Type of Wheelchair: Manual Wheelchair activity did not occur: Safety/medical concerns  Wheelchair assist level: Supervision/Verbal cueing Max wheelchair distance: 100    Wheelchair 50 feet with 2 turns activity    Assist    Wheelchair 50 feet with 2 turns activity did not occur: Safety/medical concerns   Assist Level: Supervision/Verbal cueing   Wheelchair 150 feet activity     Assist Wheelchair 150 feet activity did not occur: Safety/medical concerns         Medical Problem List and Plan: 1.  Left-sided weakness and fine motor deficits,, bowel incontinence, urinary retention secondary to cervical myelopathy status post decompression and stabilization with halo.    -Continue CIR therapies including PT, OT  2.  DVT Prophylaxis/Anticoagulation: Pharmaceutical: Lovenox 3. Pain Management: Tylenol prn--narcotics have been held due to encephalopathy with hallucinations.   -no pain at present 4. Mood: LCSW to follow for evaluation and support.  5. Neuropsych: This patient is capable of making decisions on his own behalf. 6. Skin/Wound Care: Pin care bid. Monitor incision daily for healing. Maintain adequate nutritional and hydration status.  7. Fluids/Electrolytes/Nutrition: encourage PO    -I personally reviewed the patient's labs today.   8. Epidural abscess: Continue Ceftriaxone 2 grams every 12 hours with end date 12/24. Weekly CBC/BMET/CRP/Sed rate 9. Urinary retention/Neurogenic bladder: Continue proscar and flomax.    -continue I/O caths to keep volumes less than 500cc  -stop urecholine as he has no sense of filling yet 10. Constipation/Neurogenic bowel:  AM bowel program 11. Bipolar disorder: has not been taking care of himself for the past few year--started being compliant since mid summer. Team to provide ego support. Continue Zoloft and abilify daily. Atrax prn for anxiety.   -xanax at  bedtime for sleep  -mood appears appropriate at present 12. ABLA:11/28 Hgb 9.4---> 9.8 12/2 13. T2DM: Was not taking lantus PTA.   Continue Lantus daily with novolog 8 units breakfast and 10 units bid.  CBG (last 3)  Recent Labs    10/27/18 0632 10/27/18 1853 10/28/18 0635  GLUCAP 152* 143* 150*  controlled 12/2   14.  Hypoalbuminemia- start resource supplement LOS: 5 days A FACE TO FACE EVALUATION WAS PERFORMED  Jonathan Horne 10/28/2018, 8:46 AM

## 2018-10-28 NOTE — Progress Notes (Signed)
Physical Therapy Session Note  Patient Details  Name: Jonathan Horne MRN: [id number] Date of Birth: 04/17/1961  Today's Date: 10/28/2018 PT Individual Time: 1300-1330 PT Individual Time Calculation (min): 30 min   Short Term Goals: Week 1:  PT Short Term Goal 1 (Week 1): Pt will complete bed mobility with min A consistently PT Short Term Goal 2 (Week 1): Pt will complete least restrictive transfer with min A consistently PT Short Term Goal 3 (Week 1): Pt will ambulate x 25 ft with LRAD and min A  Skilled Therapeutic Interventions/Progress Updates:    Pt supine in bed upon PT arrival, agreeable to therapy tx and reports pain 6/10 in posterior cervical region. Pt transferred to sitting EOB with min assist, BP in sitting 128/72 this session. Pt performed stand pivot to w/c with RW and min assist, transported to the gym. Pt ambulated x 64 ft and x 58 ft this session with RW and min assist, verbal cues for RW management/use. Pt reports some dizziness during ambulation. Pt transported back to room and stand pivot to bed min assist with RW. Pt left supine with needs in reach, positioned with pillows to decrease cervical pain.   Therapy Documentation Precautions:  Precautions Precautions: Cervical, Fall, Other (comment), Back Precaution Comments: HALO brace Restrictions Weight Bearing Restrictions: No    Therapy/Group: Individual Therapy  Cresenciano GenreEmily van Schagen, PT, DPT 10/28/2018, 7:52 AM

## 2018-10-28 NOTE — Progress Notes (Signed)
Occupational Therapy Session Note  Patient Details  Name: Jonathan MatteMark T Horne MRN: [id number] Date of Birth: 04/13/1961  Today's Date: 10/28/2018 OT Individual Time: [id number] OT Individual Time Calculation (min): 66 min   Short Term Goals: Week 1:  OT Short Term Goal 1 (Week 1): Pt will maintain standing balance with min A for LB clothing management.  OT Short Term Goal 2 (Week 1): Pt will don button down shirt with mod A to decrease level of assist for self care. OT Short Term Goal 3 (Week 1): Pt will tolerate standing for 5 minutes or more to increase tolerance for self care tasks and ambulation.   Skilled Therapeutic Interventions/Progress Updates:    Pt greeted in bed and amenable to tx. Stated he had a little dizziness at rest, but wanted to get up. First completed LB bathing in chair position in bed with pt able to achieve reclined figure 4 to wash LEs. OT then donned thigh high Teds and ACE wraps. He donned gripper socks. BP before transition EOB 130/79, and after 1 minute EOB BP 117/77. He completed UB bathing with supervision, reporting dizziness was ok. Sit<stand for perihygiene completed with steady assist using RW. Assist required for posterior perihygiene thoroughness due to incontinent BM in brief. BP once seated 118/70. He utilized figure 4 for threading pants EOB and he stood one more time to elevate pants over hips. When given option to complete this task in bed vs stand, he opted to stand, requiring steady assist for balance. At end of session he transitioned to supine with supervision and was repositioned for comfort. Pt left with RN at session exit.   Therapy Documentation Precautions:  Precautions Precautions: Cervical, Fall, Other (comment), Back Precaution Comments: HALO brace Restrictions Weight Bearing Restrictions: No Pain: Pt medicated at start of session  Pain Assessment Pain Scale: 0-10 Pain Score: 3  Pain Location: Head Pain Onset: On-going ADL:        Therapy/Group: Individual Therapy  [name] 10/28/2018, 12:24 PM

## 2018-10-28 NOTE — Progress Notes (Signed)
No void, continues to require I & O caths every 4-6 hours. Small stool noted before each cath. Reports sleeping better after getting tylenol and xanax at HS. Right, front pin site red, no drainage. Jonathan MartinezMurray, Jonathan Horne

## 2018-10-28 NOTE — Progress Notes (Signed)
Occupational Therapy Session Note  Patient Details  Name: Jonathan Horne MRN: [id number] Date of Birth: 10/27/1961  Today's Date: 10/28/2018 OT Individual Time: 1345-1426 OT Individual Time Calculation (min): 41 min   Short Term Goals: Week 1:  OT Short Term Goal 1 (Week 1): Pt will maintain standing balance with min A for LB clothing management.  OT Short Term Goal 2 (Week 1): Pt will don button down shirt with mod A to decrease level of assist for self care. OT Short Term Goal 3 (Week 1): Pt will tolerate standing for 5 minutes or more to increase tolerance for self care tasks and ambulation.   Skilled Therapeutic Interventions/Progress Updates:    Pt greeted semi-reclined in bed and agreeable to OT treatment session. Pt with no reports of dizziness throughout session. Pt came to sitting EOB with supervision. Stand-pivot w/ RW to wc with CGA and verbal cues for hand placement with sit<>stand. Stand-pivot in similar fashion to therapy mat. LB strengthening with blue theraband exercises hip abduction/adduction, seated hip flexion, and knee extension 2 sets of 10. Dynamic standing activity using RW for alternating toe taps 3 sets of 10- some ataxia in L LE with increased fatigue. Provided pt with blue theraputty and worked on L hand grip and pinch strength. Pt returned to room and pivoted back to bed in similar fashion as above. Pt left semi-reclined with needs met and call bell in reach.   Therapy Documentation Precautions:  Precautions Precautions: Cervical, Fall, Other (comment), Back Precaution Comments: HALO brace Restrictions Weight Bearing Restrictions: No Pain: Pain Assessment Pain Scale: 0-10 Pain Score: 3  Pain Location: Head Pain Descriptors / Indicators: Aching Pain Onset: On-going Pain Intervention(s): Repositioned  Therapy/Group: Individual Therapy   S  10/28/2018, 2:28 PM 

## 2018-10-28 NOTE — Progress Notes (Signed)
Physical Therapy Session Note  Patient Details  Name: Jonathan Horne MRN: [id number] Date of Birth: 10/11/1961  Today's Date: 10/28/2018 PT Individual Time:0910  - 1000, 50 min     Short Term Goals: Week 1:  PT Short Term Goal 1 (Week 1): Pt will complete bed mobility with min A consistently PT Short Term Goal 2 (Week 1): Pt will complete least restrictive transfer with min A consistently PT Short Term Goal 3 (Week 1): Pt will ambulate x 25 ft with LRAD and min A  Skilled Therapeutic Interventions/Progress Updates:   Pt missed 10 minutes of session due to Nsg care.  Pt supine in bed, with TEDS and ACES on bil LEs.  Bed mobility in flat bed without rails, supervision for rolling L><R, and sitting up EOB using bed features, gradually due to dizziness. BP in sitting 120/79, HR 89.  After stand pivot transfer to w/c, BP 117/81, HR 111.  Pt c/o mild dizziness in sitting  Supine strengthening: 12 x 1 bil bridging; R/L side lying 12 x 1 hip abduction iwht flexed hips and knees, 15 x 2 bil hip flexion. Seated in w/c, Kinetron at level 30/40 cm/sec x 25 cycles targeting quadruceps muscles, x 25 cycles targeting gluteal muscles, in slight trunk flexion.  Pt reported that he feels his L hip is significantly weaker, collapsing when ambulating.  Pt left resting in w/c with needs at hand, and encouragement to drink water and use incentive inspirometer.    Therapy Documentation Precautions:  Precautions Precautions: Cervical, Fall, Other (comment), Back Precaution Comments: HALO brace Restrictions Weight Bearing Restrictions: No  Pain: 6/10 posterior neck; repositioned PRN      Therapy/Group: Individual Therapy  Laycie Schriner 10/28/2018, 7:52 AM

## 2018-10-29 ENCOUNTER — Inpatient Hospital Stay (HOSPITAL_COMMUNITY): Payer: BLUE CROSS/BLUE SHIELD | Admitting: Occupational Therapy

## 2018-10-29 ENCOUNTER — Inpatient Hospital Stay (HOSPITAL_COMMUNITY): Payer: BLUE CROSS/BLUE SHIELD | Admitting: Physical Therapy

## 2018-10-29 LAB — GLUCOSE, CAPILLARY
GLUCOSE-CAPILLARY: 128 mg/dL — AB (ref 70–99)
GLUCOSE-CAPILLARY: 131 mg/dL — AB (ref 70–99)
Glucose-Capillary: 93 mg/dL (ref 70–99)
Glucose-Capillary: 112 mg/dL — ABNORMAL HIGH (ref 70–99)

## 2018-10-29 MED ORDER — METHOCARBAMOL 750 MG PO TABS
750.0000 mg | ORAL_TABLET | Freq: Four times a day (QID) | ORAL | Status: DC | PRN
  Administered 2018-11-05 (×2): 750 mg via ORAL
  Filled 2018-10-29 (×2): qty 1

## 2018-10-29 NOTE — Progress Notes (Signed)
[facility] PHYSICAL MEDICINE & REHABILITATION PROGRESS NOTE   Subjective/Complaints: Up with therapy. No new problems. bp still running a bit low with intermittent symptoms  ROS: Patient denies fever, rash, sore throat, blurred vision, nausea, vomiting, diarrhea, cough, shortness of breath or chest pain, joint or back pain, headache, or mood change.    Objective:   No results found. Recent Labs    10/28/18 0320  WBC 6.1  HGB 9.8*  HCT 32.6*  PLT 430*   Recent Labs    10/28/18 0320  NA 138  K 3.9  CL 100  CO2 33*  GLUCOSE 166*  BUN 16  CREATININE 0.62  CALCIUM 8.5*    Intake/Output Summary (Last 24 hours) at 10/29/2018 0843 Last data filed at 10/29/2018 0700 Gross per 24 hour  Intake 1020 ml  Output 3150 ml  Net -2130 ml     Physical Exam: Vital Signs Blood pressure 97/76, pulse (!) 125, temperature (!) 97.5 F (36.4 C), resp. rate 18, height 5' 10.5" (1.791 m), weight 74 kg, SpO2 96 %.   Constitutional: No distress . Vital signs reviewed. HEENT: EOMI, oral membranes moist Neck: supple Cardiovascular: RRR without murmur. No JVD    Respiratory: CTA Bilaterally without wheezes or rales. Normal effort    GI: BS +, non-tender, non-distended  Skin: No evidence of breakdown, no evidence of rash Neurologic: Cranial nerves II through XII intact, motor strength is 5/5 in Right and 4 to 4+/5 Left deltoid, bicep, tricep, grip, hip flexor, knee extensors, ankle dorsiflexor and plantar flexor---stable exam Senses LT and PP in all 4's. Normal cognition  Musculoskeletal: full ROM   Assessment/Plan: 1. 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  Physiatrist is providing close team supervision and 24 hour management of active medical problems listed below.  Physiatrist and rehab team continue to assess barriers to discharge/monitor  patient progress toward functional and medical goals  Care Tool:  Bathing  Bathing activity did not occur: Refused Body parts bathed by patient: Right arm, Left arm, Right upper leg, Left upper leg, Right lower leg, Left lower leg, Face, Front perineal area   Body parts bathed by helper: Buttocks Body parts n/a: Chest, Abdomen   Bathing assist Assist Level: Minimal Assistance - Patient > 75%     Upper Body Dressing/Undressing Upper body dressing   What is the patient wearing?: Hospital gown only    Upper body assist Assist Level: Minimal Assistance - Patient > 75%    Lower Body Dressing/Undressing Lower body dressing    Lower body dressing activity did not occur: Refused What is the patient wearing?: Pants     Lower body assist Assist for lower body dressing: Minimal Assistance - Patient > 75%     Toileting Toileting Toileting Activity did not occur (Clothing management and hygiene only): N/A (no void or bm)  Toileting assist Assist for toileting: Dependent - Patient 0%     Transfers Chair/bed transfer  Transfers assist     Chair/bed transfer assist level: Minimal Assistance - Patient > 75%     Locomotion Ambulation   Ambulation assist      Assist level: Minimal Assistance - Patient > 75% Assistive device: Walker-rolling Max distance: 64 ft   Walk 10 feet activity   Assist  Walk 10 feet activity did not occur: Safety/medical concerns  Assist level: Minimal Assistance - Patient > 75% Assistive device: Walker-rolling  Walk 50 feet activity   Assist Walk 50 feet with 2 turns activity did not occur: Safety/medical concerns  Assist level: Minimal Assistance - Patient > 75% Assistive device: Walker-rolling    Walk 150 feet activity   Assist Walk 150 feet activity did not occur: Safety/medical concerns         Walk 10 feet on uneven surface  activity   Assist Walk 10 feet on uneven surfaces activity did not occur: Safety/medical  concerns         Wheelchair     Assist Will patient use wheelchair at discharge?: (TBD) Type of Wheelchair: Manual Wheelchair activity did not occur: Safety/medical concerns  Wheelchair assist level: Contact Guard/Touching assist Max wheelchair distance: 150    Wheelchair 50 feet with 2 turns activity    Assist    Wheelchair 50 feet with 2 turns activity did not occur: Safety/medical concerns   Assist Level: Contact Guard/Touching assist   Wheelchair 150 feet activity     Assist Wheelchair 150 feet activity did not occur: Safety/medical concerns   Assist Level: Contact Guard/Touching assist     Medical Problem List and Plan: 1.  Left-sided weakness and fine motor deficits,, bowel incontinence, urinary retention secondary to cervical myelopathy status post decompression and stabilization with halo.    --Interdisciplinary Team Conference today    2.  DVT Prophylaxis/Anticoagulation: Pharmaceutical: Lovenox 3. Pain Management: Tylenol prn--narcotics have been held due to encephalopathy with hallucinations.   -no pain at present 4. Mood: LCSW to follow for evaluation and support.  5. Neuropsych: This patient is capable of making decisions on his own behalf. 6. Skin/Wound Care: Pin care bid. Monitor incision daily for healing. Maintain adequate nutritional and hydration status.  7. Fluids/Electrolytes/Nutrition: encourage PO    -push fluids, I/O negative yesterday.   8. Epidural abscess: Continue Ceftriaxone 2 grams every 12 hours with end date 12/24. Weekly CBC/BMET/CRP/Sed rate 9. Urinary retention/Neurogenic bladder: Continue proscar and flomax.    -continue I/O caths to keep volumes less than 500cc  -stopped urecholine as he has no sense of filling yet, may be affecting bp 10. Constipation/Neurogenic bowel:  AM bowel program 11. Bipolar disorder: has not been taking care of himself for the past few year--started being compliant since mid summer. Team to provide  ego support. Continue Zoloft and abilify daily. Atrax prn for anxiety.   -xanax at bedtime for sleep  -mood appears appropriate at present 12. ABLA:11/28 Hgb 9.4---> 9.8 12/2 13. T2DM: Was not taking lantus PTA.   Continue Lantus daily with novolog 8 units breakfast and 10 units bid.  CBG (last 3)  Recent Labs    10/28/18 1641 10/28/18 2139 10/29/18 0644  GLUCAP 124* 161* 128*  controlled 12/3   14.  Hypoalbuminemia- started resource supplement 15. Hypotension: bp's soft, likely related to SCI    -also needs to drink more   -TED's, acclimation, observe   -consider florinef/midodrine to support bp LOS: 6 days A FACE TO FACE EVALUATION WAS PERFORMED  Ranelle Oyster 10/29/2018, 8:43 AM

## 2018-10-29 NOTE — Consult Note (Signed)
Neuropsychological Consultation   Patient:   Jonathan Horne   DOB:   03/18/1961  MR Number:  [id number]  [facility]  [facility] [address] [facility] A [address] [id number] [place] [id number] Dept: [phone] Loc: [phone]           Date of Service:   10/28/2018  Start Time:   10 AM End Time:   11 AM  Provider/Observer:  Arley PhenixJohn Ryon Layton, Psy.D.       Clinical Neuropsychologist       Billing Code/Service: [id number] 4 Units  Chief Complaint:    Jonathan Horne is a 57 year old male who has a history of diabetes, bipolar disorder, and recent admission for pharyngeal dysphagia with strep UTI on 09/27/2018.  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  He had 3-week course of antibiotics but continued to have severe neck pain with progressing lower body weakness and numbness as well as hallucinations.  [facility] including being taken to the OR for C1-C3 laminectomy and decompression of spinal cord on 10/11/2018.  Hospital course was significant for encephalopathy with hallucinations.  This was not felt to be related to his bipolar disorder history.  Reason for Service:  The patient was referred for neuropsychological consult due to coping and adjustment issues as well as to monitor his past issues with bipolar disorder as well as his current cognitive functioning.  Below is the HPI for the current admission.  HPI: Jonathan Horne  a 57 year old male with history of T2DM, bipolar disorder, recent admission for pharyngeal dysphagia with strep UTI 10/27- 09/27/18.  History taken from chart review and patient.  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  He was discharged to home  with 3 weeks course antibiotic but continued to have severe neck pain progressing LUE numbness and weakness as well as LLE numbness and to hallucinations.  [facility] for work-up.  He was noted to be tachycardic and treated with fluid bolus and x-rays done revealing progression of epidural abscess with compression of upper cervical cord with increased signal at atlantal occipital articulation with suspicion of instability, soft tissue thickening can concerning for inflammation C1-C2 with phlegmon and concerns for developing abscess.  He was taken to the OR for C1-C3 laminectomy and decompression of spinal cord on 10/11/18 by Dr. Laury DeepPeter Bronec. Halo placed for stabilization and to remain in place with pin care bid. Blood cultures X 2 11/14 were negative.  Hospital course significant for encephalopathy with hallucinations.  Tramadol and Adderall were discontinued.  Home dose Xanax decrease to bedtime due to daytime sedation.  He is also had issues with urinary retention requiring in and out caths as well as constipation. He is to continue IV ceftriaxone through 11/19/2018.  He has had improvement in LUE strength and coordination but continues to have bowel incontinence with urinary retention and significant LLE weakness.  Please also see preadmission assessment.  Current Status:  The patient was well oriented today and denied any significant mood disorder/symptoms related to either manic or depressive symptoms.  The patient was concerned about the degree of hallucinations and confusions that he had during his [facility] but felt that the symptoms had cleared up.  The  patient reports an inability to recall much of the events around this time earlier in November.  Behavioral Observation: Jonathan Horne  presents as a 57 y.o.-year-old Right Caucasian Male who appeared his stated age. his dress was Appropriate and he was Well Groomed and his manners were Appropriate to the  situation.  his participation was indicative of Appropriate and Attentive behaviors.  There were any physical disabilities noted.  he displayed an appropriate level of cooperation and motivation.     Interactions:    Active Appropriate and Attentive  Attention:   within normal limits and attention span and concentration were age appropriate  Memory:   abnormal; remote memory intact, recent memory impaired  Visuo-spatial:  not examined  Speech (Volume):  low  Speech:   normal; normal  Thought Process:  Coherent and Relevant  Though Content:  WNL; not suicidal and not homicidal  Orientation:   person, place, time/date and situation  Judgment:   Good  Planning:   Good  Affect:    Anxious  Mood:    Anxious  Insight:   Good  Intelligence:   normal  Medical History:   Past Medical History:  Diagnosis Date  . ADHD   . Bipolar 1 disorder (HCC)   . Chronic anemia   . Diabetes mellitus (HCC)   . Encephalopathy due to infection 09/2018  . History of alcohol abuse   . HTN (hypertension)   . Insomnia   . Meningitis due to bacteria   . Rash    chronic  . Ulnar neuropathy     Psychiatric History:  The patient does have a prior history of bipolar disorder as well as a diagnosis of ADHD.  It is likely that the ADHD is really related to his underlying bipolar disorder and would supersede that diagnosis.  The patient also has a history of alcohol abuse.  Recent severe disturbance in mental status was related to encephalopathy due to infection and unlikely related to his bipolar disorder.  Family Med/Psych History:  Family History  Problem Relation Age of Onset  . Coronary artery disease Father   . Diabetes Father   . Alcohol abuse Brother   . Asperger's syndrome Son   . Asperger's syndrome Daughter     Risk of Suicide/Violence: low the patient denies any suicidal or homicidal ideation.  Impression/DX:  Jonathan Horne is a 57 year old male who has a history of diabetes,  bipolar disorder, and recent admission for pharyngeal dysphagia with strep UTI on 09/27/2018.  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  He had 3-week course of antibiotics but continued to have severe neck pain with progressing lower body weakness and numbness as well as hallucinations.  The patient was treated at [facility] including being taken to the OR for C1-C3 laminectomy and decompression of spinal cord on 10/11/2018.  Hospital course was significant for encephalopathy with hallucinations.  This was not felt to be related to his bipolar disorder history.  The patient was well oriented today and denied any significant mood disorder/symptoms related to either manic or depressive symptoms.  The patient was concerned about the degree of hallucinations and confusions that he had during his hospitalization at [facility] but felt that the symptoms had cleared up.  The patient reports an inability to recall much of the events around this time earlier in November.  The patient does have a prior  history of bipolar disorder as well as a diagnosis of ADHD.  It is likely that the ADHD is really related to his underlying bipolar disorder and would supersede that diagnosis.  The patient also has a history of alcohol abuse.  Recent severe disturbance in mental status was related to encephalopathy due to infection and unlikely related to his bipolar disorder.  Disposition/Plan:  We will need to keep an eye on the patient regarding potential exacerbation of his underlying bipolar disorder.  Right now, that does not appear to be an active process with the patient although he has been through a lot of recent stressors medically.  The patient's cognitive function appears to have cleared significantly.  He acknowledges past hallucinations that were likely related to his general medical condition.   He denies current hallucinations of either visual or auditory nature.  The patient denies any manic symptoms or depressive symptoms at this time.         Electronically Signed   _______________________ Arley Phenix, Psy.D.

## 2018-10-29 NOTE — Progress Notes (Signed)
Occupational Therapy Session Note  Patient Details  Name: Jonathan MatteMark T Coverdale MRN: [id number] Date of Birth: 11/24/1961  Today's Date: 10/29/2018 OT Individual Time: [id number] and [id number] OT Individual Time Calculation (min): 70 min and 59 min   Short Term Goals: Week 1:  OT Short Term Goal 1 (Week 1): Pt will maintain standing balance with min A for LB clothing management.  OT Short Term Goal 2 (Week 1): Pt will don button down shirt with mod A to decrease level of assist for self care. OT Short Term Goal 3 (Week 1): Pt will tolerate standing for 5 minutes or more to increase tolerance for self care tasks and ambulation.   Skilled Therapeutic Interventions/Progress Updates:    Session 1: Upon entering the room, pt supine in bed with HOB elevated with no c/o pain this session. Pt agreeable to OT intervention. OT donned B thigh high teds and ACE wraps to assist pt with hypotension with BP in supine of 110/91. Pt performed supine >sit with supervision to EOB. Pt verbalized dizziness with BP of 100/76 sitting on EOB and standing with RW for stand pivot transfer with min guard and BP of 104/72 once seated. Pt propelled wheelchair to sink for UB bathing tasks. Pt performed grooming with set up A to obtain all needed items. Pt donning hospital gown secondary to not having button down shirt. Pt donned pants while rolling L <> R with set up A. Pt uncomfortable seated in wheelchair and returned to bed with steady assistance and use of RW. OT discussed home set up and possible equipment needs. No further concerns at this time. Bed alarm activated and call bell within reach.   Session 2: Upon entering the room, pt supine in bed and agreeable to OT intervention. Pt with no c/o pain this session. Min guard for stand pivot transfer from bed >wheelchair with use of RW. Pt standing for 6 minutes and 7 minutes respectively with supervision - steady assistance to hand christmas tree ornaments onto tree. Many times pt  standing without UE support but needing close supervision for safety. Pt taking rest break secondary to fatigue. No c/o dizziness this session. Pt engaged in R UE strengthening and [facility] with use of blue resistive theraputty. Pt returning demonstrations with min verbal cuing for technique. Pt returning to bed at end of session with min A stand pivot transfer. Bed alarm activated and call bell within reach.   Therapy Documentation Precautions:  Precautions Precautions: Cervical, Fall, Other (comment), Back Precaution Comments: HALO brace Restrictions Weight Bearing Restrictions: No   Therapy/Group: Individual Therapy  Alen BleacherBradsher, Vinod Mikesell P 10/29/2018, 12:42 PM

## 2018-10-29 NOTE — Progress Notes (Signed)
Physical Therapy Session Note  Patient Details  Name: Jonathan MatteMark T Shepheard MRN: [id number] Date of Birth: 07/01/1961  Today's Date: 10/29/2018 PT Individual Time: 0900-1000 PT Individual Time Calculation (min): 60 min   Short Term Goals: Week 1:  PT Short Term Goal 1 (Week 1): Pt will complete bed mobility with min A consistently PT Short Term Goal 2 (Week 1): Pt will complete least restrictive transfer with min A consistently PT Short Term Goal 3 (Week 1): Pt will ambulate x 25 ft with LRAD and min A  Skilled Therapeutic Interventions/Progress Updates:    Pt received supine in bed, agreeable to PT. Pt reports some soreness in tailbone due to limited positioning options in bed due to HALO. Discussed options for positioning with pillows for relief of pressure on tailbone in semi-sidelying. Supine BP 105/75. Seated BP 100/67. Standing BP 108/72. Pt has some symptoms of orthostatic hypotension with position changes, has TED hose and BLE ACE wrapped. Supine to sit with min A for trunk control. Sit to stand and stand pivot transfer with RW and CGA. Ambulation 2 x 60 ft with RW and min A for balance, some ataxia and narrow BOS with gait. Nustep level 3, 2 x 5 min with B UE/LE for global endurance. Sit to stand 3 x 5 reps from progressively lower surface to RW with CGA for BLE functional strengthening. Sit to supine Supervision. Pt left semi-sidelying on L side with needs in reach, bed alarm in place.  Therapy Documentation Precautions:  Precautions Precautions: Cervical, Fall, Other (comment), Back Precaution Comments: HALO brace Restrictions Weight Bearing Restrictions: No   Therapy/Group: Individual Therapy  Peter Congoaylor Deshae Dickison, PT, DPT  10/29/2018, 11:24 AM

## 2018-10-30 ENCOUNTER — Inpatient Hospital Stay (HOSPITAL_COMMUNITY): Payer: BLUE CROSS/BLUE SHIELD | Admitting: Occupational Therapy

## 2018-10-30 ENCOUNTER — Inpatient Hospital Stay (HOSPITAL_COMMUNITY): Payer: BLUE CROSS/BLUE SHIELD | Admitting: Physical Therapy

## 2018-10-30 LAB — GLUCOSE, CAPILLARY
GLUCOSE-CAPILLARY: 113 mg/dL — AB (ref 70–99)
Glucose-Capillary: 150 mg/dL — ABNORMAL HIGH (ref 70–99)
Glucose-Capillary: 104 mg/dL — ABNORMAL HIGH (ref 70–99)
Glucose-Capillary: 115 mg/dL — ABNORMAL HIGH (ref 70–99)

## 2018-10-30 MED ORDER — GLUCERNA SHAKE PO LIQD
237.0000 mL | Freq: Two times a day (BID) | ORAL | Status: DC
  Administered 2018-10-30 – 2018-11-06 (×12): 237 mL via ORAL

## 2018-10-30 MED ORDER — ENSURE ENLIVE PO LIQD
237.0000 mL | ORAL | Status: DC
  Administered 2018-11-01: 237 mL via ORAL

## 2018-10-30 NOTE — Progress Notes (Signed)
Occupational Therapy Session Note  Patient Details  Name: Jonathan Horne MRN: [id number] Date of Birth: 06/24/1961  Today's Date: 10/30/2018 OT Individual Time: [id number] and 1400-1457 OT Individual Time Calculation (min): 73 min and 57 min    Short Term Goals: Week 1:  OT Short Term Goal 1 (Week 1): Pt will maintain standing balance with min A for LB clothing management.  OT Short Term Goal 2 (Week 1): Pt will don button down shirt with mod A to decrease level of assist for self care. OT Short Term Goal 3 (Week 1): Pt will tolerate standing for 5 minutes or more to increase tolerance for self care tasks and ambulation.   Skilled Therapeutic Interventions/Progress Updates:    Session 1: Upon entering the room, pt supine in bed with RN present giving medication and connecting pt to IV. Pt donning pull over pants with rolling L <> R to pull over B hips with supervision overall. Supine >sit with HOB flat and supervision with min verbal cuing for technique. Pt transferred from bed> wheelchair with min guard and use of RW. Pt seated in wheelchair at sink for grooming tasks with set up A to obtain needed items. IV battery running very low and must be plugged in and therefore therapy taking place in room. Pt engaged in ADL tasks of folding clothing with supervision and able to fasten zippers and buttons with increased time. Pt able to complete task but increased difficulty with clothing donned as pt is unable to see buttons and fasten himself secondary to halo. Pt engaged in B LE exercises with use of blue resistive theraband with min verbal cuing for proper technique. Pt returning to bed at end of session for comfort. Call bell and all needed items within reach.   Session 2: Upon entering the room, pt supine in bed and sleeping soundly. Pt agreeable to OT intervention this session. Supine >sit with supervision and use of bed rail to EOB. Pt transferred into wheelchair with min A and use of RW. OT assisted pt  to ADL apartment. Focus on functional mobility on carpeted surface, furniture transfer, and bed transfer. Pt transferring onto low sofa and recliner chair with RW and min A this session with min verbal cuing for technique. Pt attempting standard bed in apartment of 27 inches and able to lay flat with pillow comfortable per pt. Min cuing for technique and min A for supine>sit without use of bed rails. Pt is unsure if bed is higher and will have wife measure. Pt ambulating back towards room 215' with RW and min A. Pt returning to bed secondary to fatigue. Call bell and all needed items within reach upon exiting the room.   Therapy Documentation Precautions:  Precautions Precautions: Cervical, Fall, Other (comment), Back Precaution Comments: HALO brace Restrictions Weight Bearing Restrictions: No   Therapy/Group: Individual Therapy  Alen BleacherBradsher, Sharene Krikorian P 10/30/2018, 12:30 PM

## 2018-10-30 NOTE — Progress Notes (Signed)
Nutrition Follow-up  DOCUMENTATION CODES:   Non-severe (moderate) malnutrition in context of acute illness/injury  INTERVENTION:   - Continue Glucerna Shake po BID, each supplement provides 220 kcal and 10 grams of protein  - Ensure Enlive po daily, each supplement provides 350 kcal and 20 grams of protein  - d/c Pro-stat  - Continue snacks BID  NUTRITION DIAGNOSIS:   Moderate Malnutrition related to acute illness as evidenced by mild fat depletion, mild muscle depletion.  Ongoing  GOAL:   Patient will meet greater than or equal to 90% of their needs  Progressing  MONITOR:   PO intake, Supplement acceptance, Weight trends, Labs, Skin  REASON FOR ASSESSMENT:   Malnutrition Screening Tool    ASSESSMENT:   57 year old male with history of Type 2 DM, bipolar disorder, recent admission for pharyngeal dysphagia with strep UTI. 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 He was discharged to home with 3 weeks course antibiotic but continued to have severe neck pain progressing LUE numbness and weakness as well as LLE numbness and to hallucinations. He was taken to the OR for C1-C3 laminectomy and decompression of spinal cord on 11/15.  Spoke with pt at bedside who reports good appetite. Pt notes that he has been having "lows" in the evening. RD to change Glucerna order to BID and add Ensure Enlive daily with dinner.  Meal Completion: 100% for 6 out of last 8 meals  Medications reviewed and include: Dulcolax, Glucerna TID (pt accepting >75% of times offered), Pro-stat BID (pt refusing 50% of times offered), SSI, Novolog 10 units BID before lunch and supper, Novolog 8 units daily before breakfast, Lantus 28 units daily, Beneprotein, Senokot-S, IV antibiotics  Labs reviewed. CBG's: 150, 112, 93, 131 x 24 hours  UOP: 3110 ml x 24 hours  Diet Order:   Diet Order            Diet Carb Modified  Fluid consistency: Thin; Room service appropriate? Yes  Diet effective now              EDUCATION NEEDS:   No education needs have been identified at this time  Skin:  Skin Assessment: Skin Integrity Issues: Stage I: sacrum  Last BM:  12/4 (medium type 2)  Height:   Ht Readings from Last 1 Encounters:  10/23/18 5' 10.5" (1.791 m)    Weight:   Wt Readings from Last 1 Encounters:  10/30/18 81.6 kg    Ideal Body Weight:  76.81 kg  BMI:  Body mass index is 25.46 kg/m.  Estimated Nutritional Needs:   Kcal:  2150-2350 kcal  Protein:  90-105 grams  Fluid:  >/= 2.1 L/day    Earma ReadingKate Jablonski Kerrigan Glendening, MS, RD, LDN Inpatient Clinical Dietitian Pager: [phone] Weekend/After Hours: [phone]

## 2018-10-30 NOTE — Progress Notes (Signed)
[facility] PHYSICAL MEDICINE & REHABILITATION PROGRESS NOTE   Subjective/Complaints: Has some general soreness from halo. Also problems with bed this morning, can't elevate head  ROS: Patient denies fever, rash, sore throat, blurred vision, nausea, vomiting, diarrhea, cough, shortness of breath or chest pain, headache, or mood change.   Objective:   No results found. Recent Labs    10/28/18 0320  WBC 6.1  HGB 9.8*  HCT 32.6*  PLT 430*   Recent Labs    10/28/18 0320  NA 138  K 3.9  CL 100  CO2 33*  GLUCOSE 166*  BUN 16  CREATININE 0.62  CALCIUM 8.5*    Intake/Output Summary (Last 24 hours) at 10/30/2018 0914 Last data filed at 10/30/2018 0817 Gross per 24 hour  Intake 942 ml  Output 3110 ml  Net -2168 ml     Physical Exam: Vital Signs Blood pressure 93/78, pulse (!) 104, temperature (!) 97.5 F (36.4 C), resp. rate 17, height 5' 10.5" (1.791 m), weight 81.6 kg, SpO2 100 %.   Constitutional: No distress . Vital signs reviewed. HEENT: EOMI, oral membranes moist. Halo pin sites intact Neck: supple Cardiovascular: RRR without murmur. No JVD    Respiratory: CTA Bilaterally without wheezes or rales. Normal effort    GI: BS +, non-tender, non-distended  Neurologic: Cranial nerves II through XII intact, motor strength is 5/5 in Right and 4 to 4+/5 Left deltoid, bicep, tricep, grip, hip flexor, knee extensors, ankle dorsiflexor and plantar flexor---stable exam Senses LT and PP in all 4's.   Musculoskeletal: full ROM   Assessment/Plan: 1. 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  Physiatrist is providing close team supervision and 24 hour management of active medical problems listed below.  Physiatrist and rehab team continue to assess barriers to discharge/monitor patient progress toward functional and medical goals  Care  Tool:  Bathing  Bathing activity did not occur: Refused Body parts bathed by patient: Left arm, Right arm   Body parts bathed by helper: Buttocks Body parts n/a: Chest, Abdomen   Bathing assist Assist Level: Supervision/Verbal cueing     Upper Body Dressing/Undressing Upper body dressing   What is the patient wearing?: Hospital gown only    Upper body assist Assist Level: Set up assist    Lower Body Dressing/Undressing Lower body dressing    Lower body dressing activity did not occur: Refused What is the patient wearing?: Pants     Lower body assist Assist for lower body dressing: Set up assist     Toileting Toileting Toileting Activity did not occur (Clothing management and hygiene only): N/A (no void or bm)  Toileting assist Assist for toileting: Dependent - Patient 0%     Transfers Chair/bed transfer  Transfers assist     Chair/bed transfer assist level: Contact Guard/Touching assist     Locomotion Ambulation   Ambulation assist      Assist level: Minimal Assistance - Patient > 75% Assistive device: Walker-rolling Max distance: 60'   Walk 10 feet activity   Assist  Walk 10 feet activity did not occur: Safety/medical concerns  Assist level: Minimal Assistance - Patient > 75% Assistive device: Walker-rolling   Walk 50 feet activity   Assist Walk 50 feet with 2 turns activity did not occur: Safety/medical concerns  Assist level: Minimal Assistance - Patient > 75% Assistive device: Walker-rolling    Walk 150 feet activity  Assist Walk 150 feet activity did not occur: Safety/medical concerns         Walk 10 feet on uneven surface  activity   Assist Walk 10 feet on uneven surfaces activity did not occur: Safety/medical concerns         Wheelchair     Assist Will patient use wheelchair at discharge?: (TBD) Type of Wheelchair: Manual Wheelchair activity did not occur: Safety/medical concerns  Wheelchair assist level:  Contact Guard/Touching assist Max wheelchair distance: 150    Wheelchair 50 feet with 2 turns activity    Assist    Wheelchair 50 feet with 2 turns activity did not occur: Safety/medical concerns   Assist Level: Contact Guard/Touching assist   Wheelchair 150 feet activity     Assist Wheelchair 150 feet activity did not occur: Safety/medical concerns   Assist Level: Contact Guard/Touching assist     Medical Problem List and Plan: 1.  Left-sided weakness and fine motor deficits,, bowel incontinence, urinary retention secondary to cervical myelopathy status post decompression and stabilization with halo.    --Continue CIR therapies including PT, OT    2.  DVT Prophylaxis/Anticoagulation: Pharmaceutical: Lovenox 3. Pain Management: Tylenol prn--narcotics have been held due to encephalopathy with hallucinations.   -no pain at present 4. Mood: LCSW to follow for evaluation and support.  5. Neuropsych: This patient is capable of making decisions on his own behalf. 6. Skin/Wound Care: Pin care bid. Monitor incision daily for healing. Maintain adequate nutritional and hydration status.  7. Fluids/Electrolytes/Nutrition: encourage PO    -push fluids, I/O negative yesterday.   8. Epidural abscess: Continue Ceftriaxone 2 grams every 12 hours with end date 12/24. Weekly CBC/BMET/CRP/Sed rate 9. Urinary retention/Neurogenic bladder: Continue proscar and flomax.    -continue I/O caths to keep volumes less than 500cc  -stopped urecholine as he has no sense of filling yet, may be affecting bp 10. Constipation/Neurogenic bowel:  AM bowel program 11. Bipolar disorder: has not been taking care of himself for the past few year--started being compliant since mid summer. Team to provide ego support. Continue Zoloft and abilify daily. Atrax prn for anxiety.   -xanax at bedtime for sleep  -mood appropriate at this time 12. ABLA:11/28 Hgb 9.4---> 9.8 12/2 13. T2DM: Was not taking lantus PTA.    Continue Lantus daily with novolog 8 units breakfast and 10 units bid.  CBG (last 3)  Recent Labs    10/29/18 1623 10/29/18 2125 10/30/18 0650  GLUCAP 93 112* 150*  reasonable control 12/5   14.  Hypoalbuminemia- started resource supplement 15. Hypotension: bp's showing some improvement, less symptomatic   -push fluids   -TED's, acclimation, observe   -consider florinef/midodrine to support bp LOS: 7 days A FACE TO FACE EVALUATION WAS PERFORMED  Jonathan Horne 10/30/2018, 9:14 AM

## 2018-10-30 NOTE — Progress Notes (Signed)
Physical Therapy Session Note  Patient Details  Name: Jonathan MatteMark T Hilliker MRN: [id number] Date of Birth: 07/26/1961  Today's Date: 10/30/2018 PT Individual Time: 1100-1200 PT Individual Time Calculation (min): 60 min   Short Term Goals: Week 1:  PT Short Term Goal 1 (Week 1): Pt will complete bed mobility with min A consistently PT Short Term Goal 2 (Week 1): Pt will complete least restrictive transfer with min A consistently PT Short Term Goal 3 (Week 1): Pt will ambulate x 25 ft with LRAD and min A  Skilled Therapeutic Interventions/Progress Updates:    Pt received seated in bed, agreeable to PT. No complaints of pain. Session focus on bed mobility. Pt is able to roll L/R with use of bedrails and Supervision. Supine to sit with Supervision to min A with HOB elevated and use of bedrails. Sit to supine Supervision with use of bedrails. Sit to stand 2 x 5 reps to RW with CGA for BLE strengthening. Ambulation 2 x 165 ft with RW and min A, occasionally ataxic and decreased control of LLE noted with gait. Standing alternating L/R cone taps with BUE support on RW and min A for balance for LE coordination training. Assisted pt back to bed at end of therapy session per pt request. Pt left semi-reclined in bed set up for lunch, needs in reach. Pt has intermittent light-headedness throughout therapy session that resolves quickly. Pt has TED hose and BLE ACE wrap on during therapy session.  Therapy Documentation Precautions:  Precautions Precautions: Cervical, Fall, Other (comment), Back Precaution Comments: HALO brace Restrictions Weight Bearing Restrictions: No Pain: Pain Assessment Pain Scale: 0-10 Pain Score: 0-No pain    Therapy/Group: Individual Therapy  Peter Congoaylor Aslan Himes, PT, DPT  10/30/2018, 12:19 PM

## 2018-10-30 NOTE — Progress Notes (Signed)
Social Work Patient ID: Jonathan Horne, male   DOB: 1961-03-25, 57 y.o.   MRN: [id number]   CSW met with pt 10-29-18 to update him on team conference discussion and to give him targeted d/c date of 11-06-18.  Pt was pleased with this.  He stated his bowels have begun to work on their own and he feels his bladder will follow.  Pt is seeing progress and enjoying therapy, but looks forward to going home.  CSW will continue to follow and assist as needed.

## 2018-10-30 NOTE — Patient Care Conference (Signed)
Inpatient RehabilitationTeam Conference and Plan of Care Update Date: 10/29/2018   Time: 2:00 PM    Patient Name: Jonathan MatteMark T Basu      Medical Record Number: [id number]  Date of Birth: 04/26/1961 Sex: Male         Room/Bed: 4W11C/4W11C-01 Payor Info: Payor: BLUE CROSS BLUE SHIELD / Plan: BCBS OTHER / Product Type: *No Product type* /    Admitting Diagnosis: SCI  Admit Date/Time:  10/23/2018 12:42 PM Admission Comments: No comment available   Primary Diagnosis:  <principal problem not specified> Principal Problem: <principal problem not specified>  Patient Active Problem List   Diagnosis Date Noted  . Cervical myelopathy (HCC) 10/23/2018  . Constipation   . Encephalopathy   . Neurogenic bladder   . Epidural abscess   . Neurogenic bowel   . Bipolar disorder (HCC)   . Acute blood loss anemia   . Diabetes mellitus type 2 in nonobese [facility](HCC)     Expected Discharge Date: Expected Discharge Date: 11/06/18  Team Members Present: Physician leading conference: Dr. Faith RogueZachary Swartz Social Worker Present: Staci AcostaJenny Jeromiah Ohalloran, LCSW Nurse Present: Kennyth ArnoldStacey Jennings, RN PT Present: Other (comment)(Taylor Serita Griturkalo, PT) OT Present: Jackquline DenmarkKatie Bradsher, OT PPS Coordinator present : Tora DuckMarie Noel, RN, CRRN;Melissa Greta DoomBowie     Current Status/Progress Goal Weekly Team Focus  Medical   cervical abscess requiring decompression, halo. on abx, neurogenic bowel and bladder. hx of bipolar  improve balance, mobility  wound care, id considerations, bowel and bladder mgt   Bowel/Bladder    Bladder program In and out Cath LBM 12/02  Pt will void/pt will be educated to self cath continent of B/B   Continue I&O cath laxatives prn toilet pt q2h   Swallow/Nutrition/ Hydration             ADL's   Min A bathing bedlevel/EOB/sit<stand with RW, Min-Total A UB dressing (hospital gown vs button up shirt), Min-Mod A LB dressing with Teds + ACE wraps for BP control, Stedy toilet transfers  supervision overall   Lt NMR, functional  transfers, sit<stands, balance, general strengthening and endurance, d/c planning    Mobility   CGA with transfers, min A short distance gait 6565' with RW, min A stairs  Supervision  standing tolerance, increasing gait distance   Communication             Safety/Cognition/ Behavioral Observations            Pain   pt has headaches relieved by Tylenol prn   <=2/10  Assess pain qshift and prn medicate prn   Skin   Halo traction pin sites intact no s/sx of infection skin dry heels pink  skin integrity maintained no s/sx of infection or breakdown   assess skin qshift and prn halo pin site care as directed    Rehab Goals Patient on target to meet rehab goals: Yes Rehab Goals Revised: none *See Care Plan and progress notes for long and short-term goals.     Barriers to Discharge  Current Status/Progress Possible Resolutions Date Resolved   Physician    Medical stability;Neurogenic Bowel & Bladder        see medical progress notes      Nursing                  PT  Medical stability;Home environment access/layout;Other (comments)  orthostatic hypotension              OT  SLP                SW                Discharge Planning/Teaching Needs:  Pt plans to return to his home and his wife and son will take turns providing pt with supervision/assistance.  Family can come for family education closer to d/c.   Team Discussion:  Pt with cervical abscess, decompression with halo.  Pt has neurogenic bowel and bladder and team working on this.  Nursing is cathing pt every 6 hours and are adding a snack at night for blood sugars.  Pt c/o of coccyx hurting, as he is having trouble getting comfortable with halo.  Staff to use pillows to relieve pressure.  Pt may need hospital bed at home so that he can elevate bed to get in a good sleeping position with halo.  Pt is contact guard to min A with RW 60'.  Working on stairs.  Pt with some orthostatic hypotension and TEDS are helping.   Pt with S level OT goals.  Pt is currently set up/min A level for self care.  Pt with halo in bed is biggest challenge in getting comfortable and doing self care.    Revisions to Treatment Plan:  none    Continued Need for Acute Rehabilitation Level of Care: The patient requires daily medical management by a physician with specialized training in physical medicine and rehabilitation for the following conditions: Daily direction of a multidisciplinary physical rehabilitation program to ensure safe treatment while eliciting the highest outcome that is of practical value to the patient.: Yes Daily medical management of patient stability for increased activity during participation in an intensive rehabilitation regime.: Yes Daily analysis of laboratory values and/or radiology reports with any subsequent need for medication adjustment of medical intervention for : Post surgical problems;Neurological problems;Urological problems   I attest that I was present, lead the team conference, and concur with the assessment and plan of the team.   Daneshia Tavano, [place] 10/30/2018, 11:13 AM

## 2018-10-31 ENCOUNTER — Inpatient Hospital Stay (HOSPITAL_COMMUNITY): Payer: BLUE CROSS/BLUE SHIELD | Admitting: Occupational Therapy

## 2018-10-31 ENCOUNTER — Inpatient Hospital Stay (HOSPITAL_COMMUNITY): Payer: BLUE CROSS/BLUE SHIELD | Admitting: Physical Therapy

## 2018-10-31 LAB — GLUCOSE, CAPILLARY
Glucose-Capillary: 179 mg/dL — ABNORMAL HIGH (ref 70–99)
Glucose-Capillary: 115 mg/dL — ABNORMAL HIGH (ref 70–99)
Glucose-Capillary: 76 mg/dL (ref 70–99)
Glucose-Capillary: 148 mg/dL — ABNORMAL HIGH (ref 70–99)

## 2018-10-31 NOTE — Progress Notes (Signed)
Physical Therapy Session Note  Patient Details  Name: Jonathan MatteMark T Overbeck MRN: [id number] Date of Birth: 02/19/1961  Today's Date: 10/31/2018 PT Individual Time: 1300-1400 PT Individual Time Calculation (min): 60 min   Short Term Goals: Week 1:  PT Short Term Goal 1 (Week 1): Pt will complete bed mobility with min A consistently PT Short Term Goal 2 (Week 1): Pt will complete least restrictive transfer with min A consistently PT Short Term Goal 3 (Week 1): Pt will ambulate x 25 ft with LRAD and min A  Skilled Therapeutic Interventions/Progress Updates:    Pt received seated in bed, agreeable to PT. No complaints of pain. Supine to sit min A with use of bedrail and assist for trunk control. Sit to stand with CGA to RW. Ambulation 2 x 150 ft with RW and CGA. Ascend/descend 4 stairs with 2 handrails and step-to gait pattern with min A for balance. Ascend/descend 8 stairs laterally with one handrail and min A for balance to simulate home environment. Ascend/descend one 3" step with HH mod A for balance to simulate home environment with no handrails. Practiced bed transfers in real bed in simulation apartment: sit to supine with Supervision, supine to sit with min A for trunk control. Discussed purchasing bedrails for patient's bed at home once he transitions from hospital bed back to a regular bed to increased independence and safety with bed mobility. Nustep level 4, 2 x 5 min with B UE/LE for global endurance training. Pt left seated in bed with needs in reach at end of therapy session.  Therapy Documentation Precautions:  Precautions Precautions: Cervical, Fall, Other (comment), Back Precaution Comments: HALO brace Restrictions Weight Bearing Restrictions: No  Therapy/Group: Individual Therapy  Peter Congoaylor Kinslei Labine, PT, DPT  10/31/2018, 3:56 PM

## 2018-10-31 NOTE — Progress Notes (Signed)
[facility] PHYSICAL MEDICINE & REHABILITATION PROGRESS NOTE   Subjective/Complaints: No new issues. Has had ongoing issues with bed. Backside still sore  ROS: Patient denies fever, rash, sore throat, blurred vision, nausea, vomiting, diarrhea, cough, shortness of breath or chest pain, joint or back pain, headache, or mood change.    Objective:   No results found. No results for input(s): WBC, HGB, HCT, PLT in the last 72 hours. No results for input(s): NA, K, CL, CO2, GLUCOSE, BUN, CREATININE, CALCIUM in the last 72 hours.  Intake/Output Summary (Last 24 hours) at 10/31/2018 1043 Last data filed at 10/31/2018 1034 Gross per 24 hour  Intake 564 ml  Output 2475 ml  Net -1911 ml     Physical Exam: Vital Signs Blood pressure 114/77, pulse (!) 101, temperature 98.1 F (36.7 C), resp. rate 18, height 5' 10.5" (1.791 m), weight 81.6 kg, SpO2 98 %.   Constitutional: No distress . Vital signs reviewed. HEENT: EOMI, oral membranes moist Neck: supple Cardiovascular: RRR without murmur. No JVD    Respiratory: CTA Bilaterally without wheezes or rales. Normal effort    GI: BS +, non-tender, non-distended   Neurologic: Cranial nerves II through XII intact, motor strength is 5/5 in Right and 4 to 4+/5 Left deltoid, bicep, tricep, grip, hip flexor, knee extensors, ankle dorsiflexor and plantar flexor---stable exam Senses LT and PP in all 4's.   Musculoskeletal: full ROM  SKIN: sacral area with stage 1-2 area at sacrum   Assessment/Plan: 1. 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  Physiatrist is providing close team supervision and 24 hour management of active medical problems listed below.  Physiatrist and rehab team continue to assess barriers to discharge/monitor patient progress toward functional and medical goals  Care Tool:  Bathing  Bathing  activity did not occur: Refused Body parts bathed by patient: Left arm, Right arm   Body parts bathed by helper: Buttocks Body parts n/a: Chest, Abdomen   Bathing assist Assist Level: Supervision/Verbal cueing     Upper Body Dressing/Undressing Upper body dressing   What is the patient wearing?: Hospital gown only    Upper body assist Assist Level: Set up assist    Lower Body Dressing/Undressing Lower body dressing    Lower body dressing activity did not occur: Refused What is the patient wearing?: Pants     Lower body assist Assist for lower body dressing: Supervision/Verbal cueing     Toileting Toileting Toileting Activity did not occur (Clothing management and hygiene only): N/A (no void or bm)  Toileting assist Assist for toileting: Dependent - Patient 0%     Transfers Chair/bed transfer  Transfers assist     Chair/bed transfer assist level: Contact Guard/Touching assist     Locomotion Ambulation   Ambulation assist      Assist level: Minimal Assistance - Patient > 75% Assistive device: Walker-rolling Max distance: 215'   Walk 10 feet activity   Assist  Walk 10 feet activity did not occur: Safety/medical concerns  Assist level: Minimal Assistance - Patient > 75% Assistive device: Walker-rolling   Walk 50 feet activity   Assist Walk 50 feet with 2 turns activity did not occur: Safety/medical concerns  Assist level: Minimal Assistance - Patient > 75% Assistive device: Walker-rolling    Walk 150 feet activity   Assist Walk 150 feet activity did not occur: Safety/medical concerns  Assist level: Minimal Assistance - Patient >  75% Assistive device: Walker-rolling    Walk 10 feet on uneven surface  activity   Assist Walk 10 feet on uneven surfaces activity did not occur: Safety/medical concerns         Wheelchair     Assist Will patient use wheelchair at discharge?: (TBD) Type of Wheelchair: Manual Wheelchair activity did  not occur: Safety/medical concerns  Wheelchair assist level: Contact Guard/Touching assist Max wheelchair distance: 150    Wheelchair 50 feet with 2 turns activity    Assist    Wheelchair 50 feet with 2 turns activity did not occur: Safety/medical concerns   Assist Level: Contact Guard/Touching assist   Wheelchair 150 feet activity     Assist Wheelchair 150 feet activity did not occur: Safety/medical concerns   Assist Level: Contact Guard/Touching assist     Medical Problem List and Plan: 1.  Left-sided weakness and fine motor deficits,, bowel incontinence, urinary retention secondary to cervical myelopathy status post decompression and stabilization with halo.    --Continue CIR therapies including PT, OT    2.  DVT Prophylaxis/Anticoagulation: Pharmaceutical: Lovenox 3. Pain Management: Tylenol prn--narcotics have been held due to encephalopathy with hallucinations.   -no pain at present 4. Mood: LCSW to follow for evaluation and support.  5. Neuropsych: This patient is capable of making decisions on his own behalf. 6. Skin/Wound Care: Pin care bid. Monitor incision daily for healing. Maintain adequate nutritional and hydration status.   -discussed turning in bed  -air mattress 7. Fluids/Electrolytes/Nutrition: encourage PO    -push fluids, I/O negative yesterday.   8. Epidural abscess: Continue Ceftriaxone 2 grams every 12 hours with end date 12/24. Weekly CBC/BMET/CRP/Sed rate 9. Urinary retention/Neurogenic bladder: Continue proscar and flomax.    -continue I/O caths to keep volumes less than 500cc  -stopped urecholine as he has no sense of filling yet, may be affecting bp 10. Constipation/Neurogenic bowel:  AM bowel program 11. Bipolar disorder: has not been taking care of himself for the past few year--started being compliant since mid summer. Team to provide ego support. Continue Zoloft and abilify daily. Atrax prn for anxiety.   -xanax at bedtime for  sleep  -mood appropriate  12. ABLA:11/28 Hgb 9.4---> 9.8 12/2 13. T2DM: Was not taking lantus PTA.   Continue Lantus daily with novolog 8 units breakfast and 10 units bid.  CBG (last 3)  Recent Labs    10/30/18 1644 10/30/18 2250 10/31/18 0652  GLUCAP 104* 115* 179*  reasonable control 12/5   14.  Hypoalbuminemia- started resource supplement 15. Hypotension: bp's showing some improvement    -continue to push fluids--still fluid negative by docs   -TED's, acclimation, observe   -consider florinef/midodrine to support bp if needed LOS: 8 days A FACE TO FACE EVALUATION WAS PERFORMED  Ranelle OysterZachary T Vedika Dumlao 10/31/2018, 10:43 AM

## 2018-10-31 NOTE — Progress Notes (Signed)
Occupational Therapy Weekly Progress Note  Patient Details  Name: Jonathan Horne MRN: [id number] Date of Birth: [birth year]  Beginning of progress report period: October 25, 2018 End of progress report period: October 31, 2018  Today's Date: 10/31/2018 OT Individual Time: 8413-2440 and 1027-2536 OT Individual Time Calculation (min): 71 min and 57 min    Patient has met 3 of 3 short term goals. Pt making steady progress towards occupational therapy goals this week. Pt performing functional transfers/ambulation with use of R and CGA. Pt is wearing B thigh high TED hose and ACE wraps to assist with BP management. Bathing completed at sink with set up A. Pt able to complete LB dressing from bed with set up A to obtain needed items. Pt needing mod A for UB dressing secondary to difficulty seeing fasteners with HALO and decreased [place]. Pt's endurance has increased as well.   Patient continues to demonstrate the following deficits: muscle weakness, decreased cardiorespiratoy endurance and decreased standing balance and decreased balance strategies, strength, and safety awareness and therefore will continue to benefit from skilled OT intervention to enhance overall performance with BADL.  Patient progressing toward long term goals..  Continue plan of care.  OT Short Term Goals Week 1:  OT Short Term Goal 1 (Week 1): Pt will maintain standing balance with min A for LB clothing management.  OT Short Term Goal 1 - Progress (Week 1): Met OT Short Term Goal 2 (Week 1): Pt will don button down shirt with mod A to decrease level of assist for self care. OT Short Term Goal 2 - Progress (Week 1): Met OT Short Term Goal 3 (Week 1): Pt will tolerate standing for 5 minutes or more to increase tolerance for self care tasks and ambulation.  OT Short Term Goal 3 - Progress (Week 1): Met Week 2:  OT Short Term Goal 1 (Week 2): STGs=LTGs secondary to upcoming discharge  Skilled Therapeutic Interventions/Progress  Updates:    Session 1:  Upon entering the room,  Pt supine in bed with no c/o pain. OT wrapping B LEs with ACE wrap and donning thigh high TED hose to address hypotension. Pt asymptomatic this session. Pt requesting to wash at sink. Set up A for UB self care at sink and grooming tasks. OT assisted pt with cutting his finger nails per pt request. Pt ambulating 150' with RW and min guard before returning to room at end of session. Pt declined sitting up in wheelchair secondary to discomfort on buttocks. Pt returned to bed with min guard for stand pivot transfer. Call bell and all needed items within reach. Pt set up with lunch tray before exiting the room.   Session 2: Upon entering the room, pt supine in bed awaiting OT arrival with no c/o pain this session. Pt supine >sit from flat bed with CGA and min verbal cuing for technique. Pt ambulating with RW and min guard to dresser to locate wallet and zip up jacket. Pt needing mod A to don and fasten jacket zipper. Pt seated in wheelchair and assist to gift shop. Focus of session on community mobility task with use of RW. Pt able to manage aisles, turns, and picking up items from shelves with min guard for balance. Pt taking seated rest break and then ambulating outside on uneven surfaces for 100' with min guard as well. Pt returning back to unit at end of session and transferred back into bed for comfort with min guard. Call bell and all needed items  within reach upon exiting the room.   Therapy Documentation Precautions:  Precautions Precautions: Cervical, Fall, Other (comment), Back Precaution Comments: HALO brace Restrictions Weight Bearing Restrictions: No  Pain: Pain Assessment Pain Scale: 0-10 Pain Score: 0-No pain   Therapy/Group: Individual Therapy  Gypsy Decant 10/31/2018, 12:50 PM

## 2018-11-01 ENCOUNTER — Inpatient Hospital Stay (HOSPITAL_COMMUNITY): Payer: BLUE CROSS/BLUE SHIELD | Admitting: Physical Therapy

## 2018-11-01 ENCOUNTER — Inpatient Hospital Stay (HOSPITAL_COMMUNITY): Payer: BLUE CROSS/BLUE SHIELD | Admitting: Occupational Therapy

## 2018-11-01 ENCOUNTER — Inpatient Hospital Stay (HOSPITAL_COMMUNITY): Payer: BLUE CROSS/BLUE SHIELD

## 2018-11-01 LAB — GLUCOSE, CAPILLARY
Glucose-Capillary: 177 mg/dL — ABNORMAL HIGH (ref 70–99)
Glucose-Capillary: 197 mg/dL — ABNORMAL HIGH (ref 70–99)
Glucose-Capillary: 203 mg/dL — ABNORMAL HIGH (ref 70–99)
Glucose-Capillary: 133 mg/dL — ABNORMAL HIGH (ref 70–99)

## 2018-11-01 NOTE — Progress Notes (Signed)
Occupational Therapy Session Note  Patient Details  Name: Jonathan Horne MRN: [id number] Date of Birth: 1960/12/05  Today's Date: 11/01/2018 OT Individual Time: 3403-5248 OT Individual Time Calculation (min): 40 min    Short Term Goals: Week 1:  OT Short Term Goal 1 (Week 1): Pt will maintain standing balance with min A for LB clothing management.  OT Short Term Goal 1 - Progress (Week 1): Met OT Short Term Goal 2 (Week 1): Pt will don button down shirt with mod A to decrease level of assist for self care. OT Short Term Goal 2 - Progress (Week 1): Met OT Short Term Goal 3 (Week 1): Pt will tolerate standing for 5 minutes or more to increase tolerance for self care tasks and ambulation.  OT Short Term Goal 3 - Progress (Week 1): Met  Skilled Therapeutic Interventions/Progress Updates:    1;1. Pt c.o pian in head from HALO, however tolerable. OT ace wraps over teds and pt ambulates with CGA to dayroom with RW. Pt completes no sew blanket activity marking lines, cutting strips with scissors and tying knots with supervision and VC for technique to improve BUE FMC. Pt requires 2 rest breaks d/t fatigue/decreased activity tolerance. Exited session with pt seated in bed, call light in reach and all needs met.  Therapy Documentation Precautions:  Precautions Precautions: Cervical, Fall, Other (comment), Back Precaution Comments: HALO brace Restrictions Weight Bearing Restrictions: No General:   Vital Signs: Therapy Vitals Temp: 97.9 F (36.6 C) Temp Source: Oral Pulse Rate: (!) 104 Resp: 18 BP: 133/89 Patient Position (if appropriate): Sitting Oxygen Therapy SpO2: 100 % O2 Device: Room Air Pain: Pain Assessment Pain Scale: 0-10 Pain Score: 0-No pain ADL:   Vision   Perception    Praxis   Exercises:   Other Treatments:     Therapy/Group: Individual Therapy  Tonny Branch 11/01/2018, 2:49 PM

## 2018-11-01 NOTE — Progress Notes (Signed)
Occupational Therapy Session Note  Patient Details  Name: Jonathan Horne MRN: [id number] Date of Birth: 10/11/1961  Today's Date: 11/01/2018 OT Individual Time: 7829-56211035-1207 OT Individual Time Calculation (min): 92 min    Short Term Goals: Week 2:  OT Short Term Goal 1 (Week 2): STGs=LTGs secondary to upcoming discharge  Skilled Therapeutic Interventions/Progress Updates:    Pt greeted in bed and declined bathing or toileting. Started tx with oral care and handwashing at sink, with pt ambulating with RW and steady assist, then completing these tasks in standing. Afterwards pt was escorted down to therapy apartment via w/c. He reports being an avid cook. Worked on DME safety, standing endurance, functional ambulation, and balance while baking cookies. Pt provided with walker bag for increasing ease of item transport. He ambulated with steady assist around kitchen to gather necessary supplies, cleaning surfaces and baking items as needed. Cues provided for proper walker placement. He opted to mix ingredients while seated to conserve energy. We discussed having chairs in home kitchen and balancing sitting/standing as needed during meal prep. He reminisced about preparing food for holidays and spending time with family and his dogs. While cookies were baking, he stood at the sink to wash dishes with close supervision for balance. Pt required 1 seated rest break before all items were washed. After, he assisted OT with wiping down countertops and table while seated on w/c. At end of session he was escorted back to room and ambulated short distance back to bed. Pt returned to semi reclined position in bed and wanted to rest a bit before lunch. He was left with all needs within reach.     Therapy Documentation Precautions:  Precautions Precautions: Cervical, Fall, Other (comment), Back Precaution Comments: HALO brace Restrictions Weight Bearing Restrictions: No Pain: No c/o pain during session  Pain  Assessment Pain Scale: 0-10 Pain Score: 0-No pain ADL:       Therapy/Group: Individual Therapy  Jonathan Horne A Addi Pak 11/01/2018, 12:35 PM

## 2018-11-01 NOTE — Progress Notes (Signed)
Physical Therapy Weekly Progress Note  Patient Details  Name: Jonathan Horne MRN: [id number] Date of Birth: 1961-09-28  Beginning of progress report period: October 25, 2018 End of progress report period: November 01, 2018  Today's Date: 11/01/2018 PT Individual Time: 0800-0900 PT Individual Time Calculation (min): 60 min   Patient has met 3 of 3 short term goals.  Pt has progressing to performing bed mobility at Supervision level, transfers and gait with CGA with RW, and stairs with min A and v/c for safety. Pt's wife will be coming in this weekend to complete family training on how to provide Supervision level assist for patient once he d/c home next week.  Patient continues to demonstrate the following deficits muscle weakness, decreased coordination and decreased standing balance, decreased postural control and decreased balance strategies and therefore will continue to benefit from skilled PT intervention to increase functional independence with mobility.  Patient progressing toward long term goals..  Continue plan of care.  PT Short Term Goals Week 1:  PT Short Term Goal 1 (Week 1): Pt will complete bed mobility with min A consistently PT Short Term Goal 1 - Progress (Week 1): Met PT Short Term Goal 2 (Week 1): Pt will complete least restrictive transfer with min A consistently PT Short Term Goal 2 - Progress (Week 1): Met PT Short Term Goal 3 (Week 1): Pt will ambulate x 25 ft with LRAD and min A PT Short Term Goal 3 - Progress (Week 1): Met Week 2:  PT Short Term Goal 1 (Week 2): =LTG due to ELOS  Skilled Therapeutic Interventions/Progress Updates:  Pt received supine in bed, agreeable to PT. No complaints of pain. Supine to sit with Supervision with use of bedrails. Sit to stand with CGA to RW. Ambulation 2 x 150 ft with RW and CGA, occasional scissoring of gait and decreased coordination of LLE. Standing alt L/R 3" step taps with one UE support, progressing to 3" step-ups with one UE  support and min A for balance. Forward and lateral step-ups onto 3" step for BLE strengthening with BUE support and min A for balance. Side-steps L/R 2 x 10 ft with BUE support and CGA for balance for B hip strengthening. Supine to/from sit from flat bed x 4 reps with use of bedrail and Supervision. Pt left seated in bed with needs in reach at end of therapy session.  Therapy Documentation Precautions:  Precautions Precautions: Cervical, Fall, Other (comment), Back Precaution Comments: HALO brace Restrictions Weight Bearing Restrictions: No Pain: Pain Assessment Pain Scale: 0-10 Pain Score: 0-No pain   Therapy/Group: Individual Therapy  Excell Seltzer, PT, DPT  11/01/2018, 12:09 PM

## 2018-11-01 NOTE — Progress Notes (Addendum)
[place] PHYSICAL MEDICINE & REHABILITATION PROGRESS NOTE   Subjective/Complaints: Up with therapy. No new problems. Able to sleep  ROS: Patient denies fever, rash, sore throat, blurred vision, nausea, vomiting, diarrhea, cough, shortness of breath or chest pain, joint or back pain, headache, or mood change.     Objective:   No results found. No results for input(s): WBC, HGB, HCT, PLT in the last 72 hours. No results for input(s): NA, K, CL, CO2, GLUCOSE, BUN, CREATININE, CALCIUM in the last 72 hours.  Intake/Output Summary (Last 24 hours) at 11/01/2018 1054 Last data filed at 11/01/2018 0900 Gross per 24 hour  Intake 370 ml  Output 3150 ml  Net -2780 ml     Physical Exam: Vital Signs Blood pressure 107/80, pulse (!) 109, temperature 98 F (36.7 C), temperature source Oral, resp. rate 19, height 5' 10.5" (1.791 m), weight 81.6 kg, SpO2 100 %.   Constitutional: No distress . Vital signs reviewed. HEENT: EOMI, oral membranes moist Neck: supple Cardiovascular: RRR without murmur. No JVD    Respiratory: CTA Bilaterally without wheezes or rales. Normal effort    GI: BS +, non-tender, non-distended    Neurologic: Cranial nerves II through XII intact, motor strength is 5/5 in Right and 4 to 4+/5 Left deltoid, bicep, tricep, grip, hip flexor, knee extensors, ankle dorsiflexor and plantar flexor-stable exam Senses LT and PP in all 4's.   Musculoskeletal: full ROM  SKIN: sacral area with stage 1-2 area at sacrum   Assessment/Plan: 1. 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  Physiatrist is providing close team supervision and 24 hour management of active medical problems listed below.  Physiatrist and rehab team continue to assess barriers to discharge/monitor patient progress toward functional and medical goals  Care Tool:  Bathing  Bathing activity did not occur: Refused Body parts bathed by patient: Left arm, Right arm   Body parts bathed by helper: Buttocks Body parts n/a: Chest, Abdomen   Bathing assist Assist Level: Supervision/Verbal cueing     Upper Body Dressing/Undressing Upper body dressing   What is the patient wearing?: Button up shirt    Upper body assist Assist Level: Minimal Assistance - Patient > 75%    Lower Body Dressing/Undressing Lower body dressing    Lower body dressing activity did not occur: Refused What is the patient wearing?: Pants     Lower body assist Assist for lower body dressing: Supervision/Verbal cueing     Toileting Toileting Toileting Activity did not occur (Clothing management and hygiene only): N/A (no void or bm)  Toileting assist Assist for toileting: Dependent - Patient 0%     Transfers Chair/bed transfer  Transfers assist     Chair/bed transfer assist level: Contact Guard/Touching assist     Locomotion Ambulation   Ambulation assist      Assist level: Contact Guard/Touching assist Assistive device: Walker-rolling Max distance: 150'   Walk 10 feet activity   Assist  Walk 10 feet activity did not occur: Safety/medical concerns  Assist level: Contact Guard/Touching assist Assistive device: Walker-rolling   Walk 50 feet activity   Assist Walk 50 feet with 2 turns activity did not occur: Safety/medical concerns  Assist level: Contact Guard/Touching assist Assistive device: Walker-rolling    Walk 150 feet activity   Assist Walk 150 feet activity did not occur: Safety/medical concerns  Assist level: Contact Guard/Touching assist Assistive device: Walker-rolling  Walk 10 feet on uneven surface  activity   Assist Walk 10 feet on uneven surfaces activity did not occur: Safety/medical concerns   Assist level: Contact Guard/Touching assist Assistive device: PhotographerWalker-rolling   Wheelchair     Assist Will patient use  wheelchair at discharge?: (TBD) Type of Wheelchair: Manual Wheelchair activity did not occur: Safety/medical concerns  Wheelchair assist level: Contact Guard/Touching assist Max wheelchair distance: 150    Wheelchair 50 feet with 2 turns activity    Assist    Wheelchair 50 feet with 2 turns activity did not occur: Safety/medical concerns   Assist Level: Contact Guard/Touching assist   Wheelchair 150 feet activity     Assist Wheelchair 150 feet activity did not occur: Safety/medical concerns   Assist Level: Contact Guard/Touching assist     Medical Problem List and Plan: 1.  Left-sided weakness and fine motor deficits,, bowel incontinence, urinary retention secondary to cervical myelopathy status post decompression and stabilization with halo.    --Continue CIR therapies including PT, OT     2.  DVT Prophylaxis/Anticoagulation: Pharmaceutical: Lovenox 3. Pain Management: Tylenol prn--narcotics have been held due to encephalopathy with hallucinations.   -no pain at present 4. Mood: LCSW to follow for evaluation and support.  5. Neuropsych: This patient is capable of making decisions on his own behalf. 6. Skin/Wound Care: Pin care bid. Monitor incision daily for healing. Maintain adequate nutritional and hydration status.   -continue turning in bed  -air mattress 7. Fluids/Electrolytes/Nutrition: encourage PO    -push fluids, I/O negative yesterday.   8. Epidural abscess: Continue Ceftriaxone 2 grams every 12 hours with end date 12/24. Weekly CBC/BMET/CRP/Sed rate---on Monday 9. Urinary retention/Neurogenic bladder: Continue proscar and flomax.    -continue I/O caths to keep volumes less than 500cc  -off urecholine as he has no sense of filling yet 10. Constipation/Neurogenic bowel:  AM bowel program effective 11. Bipolar disorder: has not been taking care of himself for the past few year--started being compliant since mid summer. Team to provide ego support. Continue  Zoloft and abilify daily. Atrax prn for anxiety.   -xanax at bedtime for sleep  -mood appropriate  12. ABLA:11/28 Hgb 9.4---> 9.8 12/2 13. T2DM: Was not taking lantus PTA.   Continue Lantus daily with novolog 8 units breakfast and 10 units bid.  CBG (last 3)  Recent Labs    10/31/18 1653 10/31/18 2128 11/01/18 0635  GLUCAP 76 148* 177*  fair control 12/6---no changes   14.  Hypoalbuminemia- started resource supplement 15. Hypotension: bp's with improvement    -continue to push fluids--still fluid negative by docs but i'm not sure how accurate these are   -TED's, acclimation, observe   -consider florinef/midodrine to support bp if needed LOS: 9 days A FACE TO FACE EVALUATION WAS PERFORMED  Jonathan Horne 11/01/2018, 10:54 AM

## 2018-11-02 DIAGNOSIS — K5901 Slow transit constipation: Secondary | ICD-10-CM

## 2018-11-02 LAB — GLUCOSE, CAPILLARY
GLUCOSE-CAPILLARY: 69 mg/dL — AB (ref 70–99)
Glucose-Capillary: 107 mg/dL — ABNORMAL HIGH (ref 70–99)
Glucose-Capillary: 194 mg/dL — ABNORMAL HIGH (ref 70–99)
Glucose-Capillary: 75 mg/dL (ref 70–99)
Glucose-Capillary: 208 mg/dL — ABNORMAL HIGH (ref 70–99)

## 2018-11-02 NOTE — Progress Notes (Signed)
[place] PHYSICAL MEDICINE & REHABILITATION PROGRESS NOTE   Subjective/Complaints: Requiring intermittent catheterization.  No other complaints today.  Using Ace wrap to avoid dizziness with sitting  ROS: Patient denies rest pain shortness of breath nausea vomiting objective:   No results found. No results for input(s): WBC, HGB, HCT, PLT in the last 72 hours. No results for input(s): NA, K, CL, CO2, GLUCOSE, BUN, CREATININE, CALCIUM in the last 72 hours.  Intake/Output Summary (Last 24 hours) at 11/02/2018 1337 Last data filed at 11/02/2018 1158 Gross per 24 hour  Intake 822 ml  Output 2025 ml  Net -1203 ml     Physical Exam: Vital Signs Blood pressure 106/78, pulse 91, temperature 98.4 F (36.9 C), temperature source Oral, resp. rate 18, height 5' 10.5" (1.791 m), weight 81.6 kg, SpO2 100 %.   Constitutional: No distress . Vital signs reviewed. HEENT: EOMI, oral membranes moist Neck: supple Cardiovascular: RRR without murmur. No JVD    Respiratory: CTA Bilaterally without wheezes or rales. Normal effort    GI: BS +, non-tender, non-distended    Neurologic: Cranial nerves II through XII intact, motor strength is 5/5 in Right and 4 to 4+/5 Left deltoid, bicep, tricep, grip, hip flexor, knee extensors, ankle dorsiflexor and plantar flexor-stable exam Senses LT and PP in all 4's.   Musculoskeletal: full ROM  SKIN: sacral area with stage 1-2 area at sacrum   Assessment/Plan: 1. 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  Physiatrist is providing close team supervision and 24 hour management of active medical problems listed below.  Physiatrist and rehab team continue to assess barriers to discharge/monitor patient progress toward functional and medical goals  Care Tool:  Bathing  Bathing activity did not occur: Refused Body parts bathed  by patient: Left arm, Right arm   Body parts bathed by helper: Buttocks Body parts n/a: Chest, Abdomen   Bathing assist Assist Level: Supervision/Verbal cueing     Upper Body Dressing/Undressing Upper body dressing   What is the patient wearing?: Button up shirt    Upper body assist Assist Level: Minimal Assistance - Patient > 75%    Lower Body Dressing/Undressing Lower body dressing    Lower body dressing activity did not occur: Refused What is the patient wearing?: Pants     Lower body assist Assist for lower body dressing: Supervision/Verbal cueing     Toileting Toileting Toileting Activity did not occur (Clothing management and hygiene only): N/A (no void or bm)  Toileting assist Assist for toileting: Dependent - Patient 0%     Transfers Chair/bed transfer  Transfers assist     Chair/bed transfer assist level: Contact Guard/Touching assist     Locomotion Ambulation   Ambulation assist      Assist level: Contact Guard/Touching assist Assistive device: Walker-rolling Max distance: 150'   Walk 10 feet activity   Assist  Walk 10 feet activity did not occur: Safety/medical concerns  Assist level: Contact Guard/Touching assist Assistive device: Walker-rolling   Walk 50 feet activity   Assist Walk 50 feet with 2 turns activity did not occur: Safety/medical concerns  Assist level: Contact Guard/Touching assist Assistive device: Walker-rolling    Walk 150 feet activity   Assist Walk 150 feet activity did not occur: Safety/medical concerns  Assist level: Contact Guard/Touching assist Assistive device: Walker-rolling    Walk 10 feet on uneven surface  activity   Assist Walk 10  feet on uneven surfaces activity did not occur: Safety/medical concerns   Assist level: Contact Guard/Touching assist Assistive device: Photographer Will patient use wheelchair at discharge?: (TBD) Type of Wheelchair:  Manual Wheelchair activity did not occur: Safety/medical concerns  Wheelchair assist level: Contact Guard/Touching assist Max wheelchair distance: 150    Wheelchair 50 feet with 2 turns activity    Assist    Wheelchair 50 feet with 2 turns activity did not occur: Safety/medical concerns   Assist Level: Contact Guard/Touching assist   Wheelchair 150 feet activity     Assist Wheelchair 150 feet activity did not occur: Safety/medical concerns   Assist Level: Contact Guard/Touching assist     Medical Problem List and Plan: 1.  Left-sided weakness and fine motor deficits,, bowel incontinence, urinary retention secondary to cervical myelopathy status post decompression and stabilization with halo.    --Continue CIR therapies including PT, OT     2.  DVT Prophylaxis/Anticoagulation: Pharmaceutical: Lovenox 3. Pain Management: Tylenol prn--narcotics have been held due to encephalopathy with hallucinations.   -no pain at present 4. Mood: LCSW to follow for evaluation and support.  5. Neuropsych: This patient is capable of making decisions on his own behalf. 6. Skin/Wound Care: Pin care bid. Monitor incision daily for healing. Maintain adequate nutritional and hydration status.   -continue turning in bed  -air mattress 7. Fluids/Electrolytes/Nutrition: encourage PO    -push fluids, I/O negative yesterday.   8. Epidural abscess: Continue Ceftriaxone 2 grams every 12 hours with end date 12/24. Weekly CBC/BMET/CRP/Sed rate---on Monday 9. Urinary retention/Neurogenic bladder: Continue proscar and flomax.    -continue I/O caths to keep volumes less than 500cc  -off urecholine as he has no sense of filling yet, continue Flomax 0.4 mg nightly 10. Constipation/Neurogenic bowel:  AM bowel program effective 11. Bipolar disorder: has not been taking care of himself for the past few year--started being compliant since mid summer. Team to provide ego support. Continue Zoloft and abilify  daily. Atrax prn for anxiety.   -xanax at bedtime for sleep  -mood appropriate  12. ABLA:11/28 Hgb 9.4---> 9.8 12/2 13. T2DM: Was not taking lantus PTA.   Continue Lantus daily with novolog 8 units breakfast and 10 units bid.  CBG (last 3)  Recent Labs    11/01/18 2132 11/02/18 0601 11/02/18 1129  GLUCAP 133* 107* 194*  Control 11/02/2018   14.  Hypoalbuminemia- started resource supplement 15. Hypotension: bp's with improvement, blood pressure drops from 121 systolic to 106 systolic from lying to sitting   -Fluid intake 462 cc on 12 7 840 cc on 12 6, recheck lites on Monday   -TED's, acclimation, observe   -consider florinef/midodrine to support bp if needed LOS: 10 days A FACE TO FACE EVALUATION WAS PERFORMED  Erick Colace 11/02/2018, 1:37 PM

## 2018-11-03 ENCOUNTER — Encounter (HOSPITAL_COMMUNITY): Payer: BLUE CROSS/BLUE SHIELD | Admitting: Occupational Therapy

## 2018-11-03 ENCOUNTER — Ambulatory Visit (HOSPITAL_COMMUNITY): Payer: BLUE CROSS/BLUE SHIELD

## 2018-11-03 LAB — GLUCOSE, CAPILLARY
Glucose-Capillary: 173 mg/dL — ABNORMAL HIGH (ref 70–99)
Glucose-Capillary: 180 mg/dL — ABNORMAL HIGH (ref 70–99)
Glucose-Capillary: 147 mg/dL — ABNORMAL HIGH (ref 70–99)
Glucose-Capillary: 133 mg/dL — ABNORMAL HIGH (ref 70–99)

## 2018-11-03 NOTE — Progress Notes (Signed)
Occupational Therapy Session Note  Patient Details  Name: Jonathan Horne MRN: [id number] Date of Birth: 09/28/1961  Today's Date: 11/03/2018 OT Individual Time: 1300-1356 OT Individual Time Calculation (min): 56 min   Skilled Therapeutic Interventions/Progress Updates:    Pt greeted in bed with spouse, dtr, and friend present for family education. Discussed safe BADL routine to implement at home as well as environmental modifications to establish for maximizing safety. Spouse works as a Engineer, [employer] and verbalized understanding, reports they have plenty of places for sponge bathing and that she will work out how to manage their dogs. Had her practice donning thigh high Teds using adaptive method which she had no issue with. Spouse and dtr had hands on practice with ambulatory transfers to bathroom using RW. Practiced side-stepping across threshold with device, as pt's bathroom doorway is 23 inches wide. Educated family about having BSC in home to elevate toilet height. Next pt ambulated to dayroom and engaged in table washing so family could provide steadying-supervision assist while he completed a dynamic functional task. Discussed safe walker mgt in home and to have multiple chairs in living areas for seated rest as needed. Per spouse, they also have a w/c if he needs it at home. Sit<stand from armless chair and also low couch completed with close supervision from family. Afterwards, escorted pt to therapy apartment. Practiced transfers on and off low couch without armrest support and also kitchen mobility using RW. Encouraged spouse to have pt up and assisting with IADLs for further remediation of functional skills. Advised her to always be present and close during functional ambulation for safety. Also discussed simple HEP strategies he could use in home to strength Lt hand (such as using soup cans as weights, playing guitar, completing w/c or chair push-ups). Spouse verbalized that she will make sure he stays busy  and active. At end of session pt completed ambulatory transfer back to bed using RW with close supervision. At end of session pt left comfortably in bed with family present. No further questions from OT.   Cleared dtr and spouse on safety plant to assist with bathroom transfers.      Therapy Documentation Precautions:  Precautions Precautions: Cervical, Fall, Other (comment), Back Precaution Comments: HALO brace Restrictions Weight Bearing Restrictions: No Vital Signs: Therapy Vitals Temp: 97.8 F (36.6 C) Temp Source: Oral Pulse Rate: (!) 106 BP: 132/74 Oxygen Therapy SpO2: 100 % Pain: No c/o pain during tx    ADL:        Therapy/Group: Individual Therapy  Orest Dygert A Laruth Hanger 11/03/2018, 2:25 PM

## 2018-11-03 NOTE — Progress Notes (Signed)
Physical Therapy Session Note  Patient Details  Name: Jonathan MatteMark T Seago MRN: [id number] Date of Birth: 08/25/1961  Today's Date: 11/03/2018 PT Individual Time: 1400-1455 PT Individual Time Calculation (min): 55 min   Short Term Goals: Week 2:  PT Short Term Goal 1 (Week 2): =LTG due to ELOS  Skilled Therapeutic Interventions/Progress Updates:    Pt supine in bed upon PT arrival, agreeable to therapy tx and denies pain. Pt transferred to sitting with supervision and performed stand pivot to w/c with CGA. Pt's family present this session for family education, pt transported to the gym. Pt ascended/descended 4 steps with R rail to simulate home step up, B UE support on rail and step to pattern, therapist educating family on techniques/cues to provide. Pt ascended/descended 1 step without rails (6 inch) and min assist, pt's arm over therapist shoulder, step to pattern. Pt performed this technique to ascend/descend one step x 1 with assist from wife and x 1 with assist from daughter. Pt transported to rehab apartment, ambulated to bed with RW and supervision, performed bed mobility without bed rails min assist. Discussed adding a bed rail to home bed or using head board to help pull himself up. Pt performed car transfer with CGA stand pivot, cue to slow down and to avoid hitting halo on car door. Pt transported back to room and transferred to bed with CGA from wife. Pt left supine with needs in reach, therapist provided HEP handout and reviewed exercises.   Therapy Documentation Precautions:  Precautions Precautions: Cervical, Fall, Other (comment), Back Precaution Comments: HALO brace Restrictions Weight Bearing Restrictions: No   Therapy/Group: Individual Therapy  Cresenciano GenreEmily van Schagen, PT, DPT 11/03/2018, 7:55 AM

## 2018-11-03 NOTE — Progress Notes (Signed)
[place] PHYSICAL MEDICINE & REHABILITATION PROGRESS NOTE   Subjective/Complaints: Requiring intermittent catheterization.  No other complaints today.  Dizziness only postural. ROS: Patient denies rest pain shortness of breath nausea vomiting objective:   No results found. No results for input(s): WBC, HGB, HCT, PLT in the last 72 hours. No results for input(s): NA, K, CL, CO2, GLUCOSE, BUN, CREATININE, CALCIUM in the last 72 hours.  Intake/Output Summary (Last 24 hours) at 11/03/2018 1108 Last data filed at 11/03/2018 0500 Gross per 24 hour  Intake 666 ml  Output 2070 ml  Net -1404 ml     Physical Exam: Vital Signs Blood pressure 115/88, pulse 95, temperature 97.9 F (36.6 C), resp. rate 14, height 5' 10.5" (1.791 m), weight 81.6 kg, SpO2 100 %.   Constitutional: No distress . Vital signs reviewed. HEENT: EOMI, oral membranes moist Neck: supple Cardiovascular: RRR without murmur. No JVD    Respiratory: CTA Bilaterally without wheezes or rales. Normal effort    GI: BS +, non-tender, non-distended    Neurologic: Cranial nerves II through XII intact, motor strength is 5/5 in Right and 4 to 4+/5 Left deltoid, bicep, tricep, grip, hip flexor, knee extensors, ankle dorsiflexor and plantar flexor-stable exam Senses LT and PP in all 4's.   Musculoskeletal: full ROM  SKIN: sacral area with stage 1-2 area at sacrum   Assessment/Plan: 1. 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  Physiatrist is providing close team supervision and 24 hour management of active medical problems listed below.  Physiatrist and rehab team continue to assess barriers to discharge/monitor patient progress toward functional and medical goals  Care Tool:  Bathing  Bathing activity did not occur: Refused Body parts bathed by patient: Left arm, Right arm   Body parts  bathed by helper: Buttocks Body parts n/a: Chest, Abdomen   Bathing assist Assist Level: Supervision/Verbal cueing     Upper Body Dressing/Undressing Upper body dressing   What is the patient wearing?: Button up shirt    Upper body assist Assist Level: Minimal Assistance - Patient > 75%    Lower Body Dressing/Undressing Lower body dressing    Lower body dressing activity did not occur: Refused What is the patient wearing?: Pants     Lower body assist Assist for lower body dressing: Supervision/Verbal cueing     Toileting Toileting Toileting Activity did not occur (Clothing management and hygiene only): N/A (no void or bm)  Toileting assist Assist for toileting: Dependent - Patient 0%     Transfers Chair/bed transfer  Transfers assist     Chair/bed transfer assist level: Contact Guard/Touching assist     Locomotion Ambulation   Ambulation assist      Assist level: Contact Guard/Touching assist Assistive device: Walker-rolling Max distance: 150'   Walk 10 feet activity   Assist  Walk 10 feet activity did not occur: Safety/medical concerns  Assist level: Contact Guard/Touching assist Assistive device: Walker-rolling   Walk 50 feet activity   Assist Walk 50 feet with 2 turns activity did not occur: Safety/medical concerns  Assist level: Contact Guard/Touching assist Assistive device: Walker-rolling    Walk 150 feet activity   Assist Walk 150 feet activity did not occur: Safety/medical concerns  Assist level: Contact Guard/Touching assist Assistive device: Walker-rolling    Walk 10 feet on uneven surface  activity   Assist Walk 10 feet on uneven surfaces activity did not occur: Safety/medical  concerns   Assist level: Contact Guard/Touching assist Assistive device: Photographer Will patient use wheelchair at discharge?: (TBD) Type of Wheelchair: Manual Wheelchair activity did not occur: Safety/medical  concerns  Wheelchair assist level: Contact Guard/Touching assist Max wheelchair distance: 150    Wheelchair 50 feet with 2 turns activity    Assist    Wheelchair 50 feet with 2 turns activity did not occur: Safety/medical concerns   Assist Level: Contact Guard/Touching assist   Wheelchair 150 feet activity     Assist Wheelchair 150 feet activity did not occur: Safety/medical concerns   Assist Level: Contact Guard/Touching assist     Medical Problem List and Plan: 1.  Left-sided weakness and fine motor deficits,, bowel incontinence, urinary retention secondary to cervical myelopathy status post decompression and stabilization with halo.    --Continue CIR therapies including PT, OT     2.  DVT Prophylaxis/Anticoagulation: Pharmaceutical: Lovenox 3. Pain Management: Tylenol prn--narcotics have been held due to encephalopathy with hallucinations.   -no pain at present 4. Mood: LCSW to follow for evaluation and support.  5. Neuropsych: This patient is capable of making decisions on his own behalf. 6. Skin/Wound Care: Pin care bid. Monitor incision daily for healing. Maintain adequate nutritional and hydration status.   -continue turning in bed  -air mattress 7. Fluids/Electrolytes/Nutrition: encourage PO    -push fluids, I/O negative yesterday.   8. Epidural abscess: Continue Ceftriaxone 2 grams every 12 hours with end date 12/24. Weekly CBC/BMET/CRP/Sed rate---on Monday 9. Urinary retention/Neurogenic bladder: Continue proscar and flomax.    -continue I/O caths to keep volumes less than 500cc  -off urecholine as he has no sense of filling yet, continue Flomax 0.4 mg nightly 10. Constipation/Neurogenic bowel:  AM bowel program effective 11. Bipolar disorder: has not been taking care of himself for the past few year--started being compliant since mid summer. Team to provide ego support. Continue Zoloft and abilify daily. Atrax prn for anxiety.   -xanax at bedtime for  sleep  -mood appropriate  12. ABLA:11/28 Hgb 9.4---> 9.8 12/2 13. T2DM: Was not taking lantus PTA.   Continue Lantus daily with novolog 8 units breakfast and 10 units bid.  CBG (last 3)  Recent Labs    11/02/18 1706 11/02/18 2117 11/03/18 0833  GLUCAP 75 208* 173*  Control 11/03/2018, labile readings continue current medications   14.  Hypoalbuminemia- started resource supplement 15. Hypotension: bp's with improvement,  Vitals:   11/03/18 0613 11/03/18 0719  BP: 114/85 115/88  Pulse: 88 95  Resp: 16 14  Temp: 98 F (36.7 C) 97.9 F (36.6 C)  SpO2: 100% 100%     -Fluid intake 12/7  recheck BMET on Monday   -TED's, acclimation, observe   -consider florinef/midodrine to support bp if needed LOS: 11 days A FACE TO FACE EVALUATION WAS PERFORMED  Erick Colace 11/03/2018, 11:08 AM

## 2018-11-04 ENCOUNTER — Inpatient Hospital Stay (HOSPITAL_COMMUNITY): Payer: BLUE CROSS/BLUE SHIELD

## 2018-11-04 ENCOUNTER — Inpatient Hospital Stay (HOSPITAL_COMMUNITY): Payer: BLUE CROSS/BLUE SHIELD | Admitting: Physical Therapy

## 2018-11-04 ENCOUNTER — Inpatient Hospital Stay (HOSPITAL_COMMUNITY): Payer: BLUE CROSS/BLUE SHIELD | Admitting: Occupational Therapy

## 2018-11-04 LAB — BASIC METABOLIC PANEL
Anion gap: 7 (ref 5–15)
BUN: 13 mg/dL (ref 6–20)
CO2: 31 mmol/L (ref 22–32)
Calcium: 9 mg/dL (ref 8.9–10.3)
Chloride: 103 mmol/L (ref 98–111)
Creatinine, Ser: 0.73 mg/dL (ref 0.61–1.24)
GFR calc non Af Amer: >60 mL/min (ref >60)
Glucose, Bld: 153 mg/dL — ABNORMAL HIGH (ref 70–99)
Potassium: 4.2 mmol/L (ref 3.5–5.1)
SODIUM: 141 mmol/L (ref 135–145)

## 2018-11-04 LAB — GLUCOSE, CAPILLARY
Glucose-Capillary: 148 mg/dL — ABNORMAL HIGH (ref 70–99)
Glucose-Capillary: 169 mg/dL — ABNORMAL HIGH (ref 70–99)
Glucose-Capillary: 147 mg/dL — ABNORMAL HIGH (ref 70–99)
Glucose-Capillary: 108 mg/dL — ABNORMAL HIGH (ref 70–99)

## 2018-11-04 LAB — C-REACTIVE PROTEIN: CRP: <0.8 mg/dL (ref <1.0)

## 2018-11-04 LAB — CBC WITH DIFFERENTIAL/PLATELET
Abs Immature Granulocytes: 0.01 10*3/uL (ref 0.00–0.07)
Basophils Absolute: 0 10*3/uL (ref 0.0–0.1)
Basophils Relative: 1 %
EOS PCT: 3 %
Eosinophils Absolute: 0.2 10*3/uL (ref 0.0–0.5)
HCT: 37.2 % — ABNORMAL LOW (ref 39.0–52.0)
Hemoglobin: 10.9 g/dL — ABNORMAL LOW (ref 13.0–17.0)
Immature Granulocytes: 0 %
Lymphocytes Relative: 34 %
Lymphs Abs: 2 10*3/uL (ref 0.7–4.0)
MCH: 25.3 pg — ABNORMAL LOW (ref 26.0–34.0)
MCHC: 29.3 g/dL — ABNORMAL LOW (ref 30.0–36.0)
MCV: 86.3 fL (ref 80.0–100.0)
Monocytes Absolute: 0.6 10*3/uL (ref 0.1–1.0)
Monocytes Relative: 10 %
Neutro Abs: 3 10*3/uL (ref 1.7–7.7)
Neutrophils Relative %: 52 %
Platelets: 380 10*3/uL (ref 150–400)
RBC: 4.31 MIL/uL (ref 4.22–5.81)
RDW: 18.6 % — ABNORMAL HIGH (ref 11.5–15.5)
WBC: 5.8 10*3/uL (ref 4.0–10.5)
nRBC: 0 % (ref 0.0–0.2)

## 2018-11-04 LAB — SEDIMENTATION RATE: Sed Rate: 60 mm/hr — ABNORMAL HIGH (ref 0–16)

## 2018-11-04 NOTE — Progress Notes (Signed)
[place] PHYSICAL MEDICINE & REHABILITATION PROGRESS NOTE   Subjective/Complaints: Still requiring I/O caths. Moving bowels well. Pain controlled. Family ed today  ROS: Patient denies fever, rash, sore throat, blurred vision, nausea, vomiting, diarrhea, cough, shortness of breath or chest pain, joint or back pain, headache, or mood change.     No results found. Recent Labs    11/04/18 0412  WBC 5.8  HGB 10.9*  HCT 37.2*  PLT 380   Recent Labs    11/04/18 0412  NA 141  K 4.2  CL 103  CO2 31  GLUCOSE 153*  BUN 13  CREATININE 0.73  CALCIUM 9.0    Intake/Output Summary (Last 24 hours) at 11/04/2018 1109 Last data filed at 11/04/2018 0948 Gross per 24 hour  Intake 660 ml  Output 3400 ml  Net -2740 ml     Physical Exam: Vital Signs Blood pressure 119/80, pulse 93, temperature 98.1 F (36.7 C), resp. rate 17, height 5' 10.5" (1.791 m), weight 81.6 kg, SpO2 100 %.   Constitutional: No distress . Vital signs reviewed. HEENT: EOMI, oral membranes moist, pin sites intact without drainage. Neck: supple Cardiovascular: RRR without murmur. No JVD    Respiratory: CTA Bilaterally without wheezes or rales. Normal effort    GI: BS +, non-tender, non-distended   Neurologic: Cranial nerves II through XII intact, motor strength is 5/5 in Right and 4 to 4+/5 Left deltoid, bicep, tricep, grip, hip flexor, knee extensors, ankle dorsiflexor and plantar flexor-stable exam Senses LT and PP in all 4's.   Musculoskeletal: full ROM  SKIN: did not visualize sacrum today  Assessment/Plan: 1. 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  Physiatrist is providing close team supervision and 24 hour management of active medical problems listed below.  Physiatrist and rehab team continue to assess barriers to discharge/monitor patient progress toward  functional and medical goals  Care Tool:  Bathing  Bathing activity did not occur: Refused Body parts bathed by patient: Left arm, Right arm   Body parts bathed by helper: Buttocks Body parts n/a: Chest, Abdomen   Bathing assist Assist Level: Supervision/Verbal cueing     Upper Body Dressing/Undressing Upper body dressing   What is the patient wearing?: Hospital gown only    Upper body assist Assist Level: Supervision/Verbal cueing    Lower Body Dressing/Undressing Lower body dressing    Lower body dressing activity did not occur: Refused What is the patient wearing?: Pants     Lower body assist Assist for lower body dressing: Supervision/Verbal cueing     Toileting Toileting Toileting Activity did not occur (Clothing management and hygiene only): N/A (no void or bm)  Toileting assist Assist for toileting: Supervision/Verbal cueing     Transfers Chair/bed transfer  Transfers assist     Chair/bed transfer assist level: Supervision/Verbal cueing     Locomotion Ambulation   Ambulation assist      Assist level: Supervision/Verbal cueing Assistive device: Walker-rolling Max distance: 20'   Walk 10 feet activity   Assist  Walk 10 feet activity did not occur: Safety/medical concerns  Assist level: Supervision/Verbal cueing Assistive device: Walker-rolling   Walk 50 feet activity   Assist Walk 50 feet with 2 turns activity did not occur: Safety/medical concerns  Assist level: Contact Guard/Touching assist Assistive device: Walker-rolling    Walk 150 feet activity   Assist Walk 150 feet activity did not occur: Safety/medical concerns  Assist level: Contact Guard/Touching assist Assistive device: Walker-rolling    Walk 10 feet on uneven surface  activity   Assist Walk 10 feet on uneven surfaces activity did not occur: Safety/medical concerns   Assist level: Contact Guard/Touching assist Assistive device: Heritage managerWalker-rolling    Wheelchair     Assist Will patient use wheelchair at discharge?: (TBD) Type of Wheelchair: Manual Wheelchair activity did not occur: Safety/medical concerns  Wheelchair assist level: Contact Guard/Touching assist Max wheelchair distance: 150    Wheelchair 50 feet with 2 turns activity    Assist    Wheelchair 50 feet with 2 turns activity did not occur: Safety/medical concerns   Assist Level: Contact Guard/Touching assist   Wheelchair 150 feet activity     Assist Wheelchair 150 feet activity did not occur: Safety/medical concerns   Assist Level: Contact Guard/Touching assist     Medical Problem List and Plan: 1.  Left-sided weakness and fine motor deficits,, bowel incontinence, urinary retention secondary to cervical myelopathy status post decompression and stabilization with halo.    --Continue CIR therapies including PT, OT      2.  DVT Prophylaxis/Anticoagulation: Pharmaceutical: Lovenox 3. Pain Management: Tylenol prn--narcotics have been held due to encephalopathy with hallucinations.   -no pain at present 4. Mood: LCSW to follow for evaluation and support.  5. Neuropsych: This patient is capable of making decisions on his own behalf. 6. Skin/Wound Care: Pin care bid. Monitor incision daily for healing. Maintain adequate nutritional and hydration status.   -continue turning in bed  -air mattress 7. Fluids/Electrolytes/Nutrition: encourage PO    -push fluids, I/O negative yesterday.   8. Epidural abscess: Continue Ceftriaxone 2 grams every 12 hours with end date 12/24. Weekly CBC/BMET/CRP/Sed rate---on Monday 9. Urinary retention/Neurogenic bladder: Continue proscar and flomax.    -continue I/O caths to keep volumes less than 500cc  -still no sense of filling.   -continue Flomax 0.4 mg nightly 10. Constipation/Neurogenic bowel:  AM bowel program effective 11. Bipolar disorder: has not been taking care of himself for the past few year--started being  compliant since mid summer. Team to provide ego support. Continue Zoloft and abilify daily. Atrax prn for anxiety.   -xanax at bedtime for sleep  -mood appropriate at present 12. ABLA:11/28 Hgb 9.4---> 9.8 12/2 13. T2DM: Was not taking lantus PTA.   Continue Lantus daily with novolog 8 units breakfast and 10 units bid.  CBG (last 3)  Recent Labs    11/03/18 1627 11/03/18 2127 11/04/18 0628  GLUCAP 147* 133* 148*  Control 11/04/2018. Improved control   14.  Hypoalbuminemia- started resource supplement 15. Hypotension: bp's with improvement,  Vitals:   11/03/18 1912 11/04/18 0434  BP: 124/85 119/80  Pulse: 100 93  Resp: 16 17  Temp: 98.6 F (37 C) 98.1 F (36.7 C)  SpO2: 100% 100%     -BUN/Cr now normal 12/9   -push fluids   -TED's, acclimation, observe   -consider florinef/midodrine to support bp if needed LOS: 12 days A FACE TO FACE EVALUATION WAS PERFORMED  Ranelle OysterZachary T Lawren Sexson 11/04/2018, 11:09 AM

## 2018-11-04 NOTE — Progress Notes (Signed)
Physical Therapy Session Note  Patient Details  Name: Jonathan Horne MRN: [id number] Date of Birth: 02/28/1961  Today's Date: 11/04/2018 PT Individual Time: 1400-1500 PT Individual Time Calculation (min): 60 min   Short Term Goals: Week 2:  PT Short Term Goal 1 (Week 2): =LTG due to ELOS  Skilled Therapeutic Interventions/Progress Updates:    Pt received seated in bed, agreeable to PT. Pt reports some soreness in neck from HALO, not rated and declines intervention. Supine to sit with Supervision with use of bedrail. Reviewed how family training went over the weekend, pt and his family feel comfortable with assist level needed at home. [facility] at home and he does not plan on d/c home with a hospital bed. Sit to stand with Supervision to RW. Ambulation 2 x 150 ft with RW and Supervision. Ascend/descend 8 6" stairs with one handrail and CGA. Ascend/descend 3 3" steps with no handrail and min A and hand-held assist to simulate home environment. Car transfer with SBA with v/c for safe transfer technique and HALO management. Sit to supine Supervision. Pt left semi-reclined in bed with needs in reach at end of therapy session.  Therapy Documentation Precautions:  Precautions Precautions: Cervical, Fall, Other (comment), Back Precaution Comments: HALO brace Restrictions Weight Bearing Restrictions: No   Therapy/Group: Individual Therapy  Peter Congoaylor Hartford Maulden, PT, DPT  11/04/2018, 4:04 PM

## 2018-11-04 NOTE — Progress Notes (Signed)
Nutrition Follow-up  DOCUMENTATION CODES:   Non-severe (moderate) malnutrition in context of acute illness/injury  INTERVENTION:   - Continue Glucerna Shake po BID, each supplement provides 220 kcal and 10 grams of protein  - Continue Ensure Enlive po daily in the evening, each supplement provides 350 kcal and 20 grams of protein  - Continue snacks BID  - Double protein portions with meals  NUTRITION DIAGNOSIS:   Moderate Malnutrition related to acute illness as evidenced by mild fat depletion, mild muscle depletion.  Ongoing  GOAL:   Patient will meet greater than or equal to 90% of their needs  Met  MONITOR:   PO intake, Supplement acceptance, Weight trends, Labs, Skin  REASON FOR ASSESSMENT:   Malnutrition Screening Tool    ASSESSMENT:   57 year old male with history of Type 2 DM, bipolar disorder, recent admission for pharyngeal dysphagia with strep UTI. 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 He was discharged to home with 3 weeks course antibiotic but continued to have severe neck pain progressing LUE numbness and weakness as well as LLE numbness and to hallucinations. He was taken to the OR for C1-C3 laminectomy and decompression of spinal cord on 11/15.  Spoke with pt at bedside who reports that he is eating anything that he is given. Pt reports only receiving Ensure Enlive once but continues to receive Glucerna supplements. Pt receives his snacks only occasionally, not on a daily basis. RD to order double protein with meals to help pt meet needs if snacks are not provided.  Pt is looking forward to d/c on Wednesday.  Meal Completion: 100% x last 7 meals  Medications reviewed and include: Dulcolax, Ensure Enlive daily, Glucerna BID, SSI, Novolog 10 units BID before lunch and supper, Novolog 8 units before breakfast, Lantus 28 units daily, Resource Beneprotein, Senokot-S, IV  antibiotics  Labs reviewed. CBG's: 169, 148, 133, 147 x 24 hours  UOP: 2900 ml x 24 hours  Diet Order:   Diet Order            Diet Carb Modified Fluid consistency: Thin; Room service appropriate? Yes  Diet effective now              EDUCATION NEEDS:   No education needs have been identified at this time  Skin:  Skin Assessment: Skin Integrity Issues: Stage I: sacrum  Last BM:  12/9 (large type 4)  Height:   Ht Readings from Last 1 Encounters:  10/23/18 5' 10.5" (1.791 m)    Weight:   Wt Readings from Last 1 Encounters:  10/30/18 81.6 kg    Ideal Body Weight:  76.81 kg  BMI:  Body mass index is 25.46 kg/m.  Estimated Nutritional Needs:   Kcal:  2150-2350 kcal  Protein:  90-105 grams  Fluid:  >/= 2.1 L/day    Gaynell Face, MS, RD, LDN Inpatient Clinical Dietitian Pager: [phone] Weekend/After Hours: [phone]

## 2018-11-04 NOTE — Progress Notes (Signed)
[facility]  [facility] at DC to home to support IV ABX.  [facility] will provide in hospital teaching to support independence with IV ABX at home.  If patient discharges after hours, please call [phone].   Jonathan Horne 11/04/2018, 2:51 PM

## 2018-11-04 NOTE — Progress Notes (Signed)
Occupational Therapy Session Note  Patient Details  Name: EZIO WIECK MRN: [id number] Date of Birth: 08/15/1961  Today's Date: 11/04/2018 OT Individual Time: 2574-9355 OT Individual Time Calculation (min): 63 min   Skilled Therapeutic Interventions/Progress Updates:    Pt greeted in bed with ADL needs met. Agreeable to tx. He ambulated short distance to w/c placed in room, using RW with steady assist. Pt escorted off unit to front lobby to see Christmas tree. While seated, guided pt through gentle UE stretches prior to UE exercises. Guided him through B UE therex using blue tband x10-15 reps. Instructed pt in modifications to make on Lt side due to bilateral strength imbalance. Utilized teach back to assess understanding for this HEP. He reported he would remember exercises and did not need paper handout. Pt then ambulated short distance to couch in lobby using RW with steady assist. Able to power up from low seat with close supervision and ambulate back to w/c in manner as written above. Pt was returned to unit and opted to go back to bed. Pt was left in bed with all needs within reach.   Therapy Documentation Precautions:  Precautions Precautions: Cervical, Fall, Other (comment), Back Precaution Comments: HALO brace Restrictions Weight Bearing Restrictions: No Pain: No s/s pain during tx    ADL:     Therapy/Group: Individual Therapy  Ara Grandmaison A Vyron Fronczak 11/04/2018, 12:13 PM

## 2018-11-04 NOTE — Progress Notes (Signed)
Occupational Therapy Session Note  Patient Details  Name: Jonathan Horne MRN: [id number] Date of Birth: 05/03/1961  Today's Date: 11/04/2018 OT Individual Time: [id number] OT Individual Time Calculation (min): 70 min    Short Term Goals: Week 2:  OT Short Term Goal 1 (Week 2): STGs=LTGs secondary to upcoming discharge  Skilled Therapeutic Interventions/Progress Updates:    Upon entering the room, pt sleeping soundly in bed but easily awaken for OT intervention. Pt was agreeable and with no c/o pain this session. Pt performed bed mobility with supervision. OT providing total A to don B TED hose and ACE wraps for hypotension. Pt standing from bed and ambulating to sink with overall supervision. Sit <>stand for grooming and bathing tasks at overall supervision level. OT reviewed home measurement sheet and discussed equipment recommendations with pt verbalizing understanding and agreement. Pt ambulating into bathroom with supervision for transfer and hygiene with supervision overall and use of RW. Pt returning to bed at end of session for comfort. Call bell and all needed items within reach.   Therapy Documentation Precautions:  Precautions Precautions: Cervical, Fall, Other (comment), Back Precaution Comments: HALO brace Restrictions Weight Bearing Restrictions: No   Therapy/Group: Individual Therapy  Alen BleacherBradsher, Emanuell Morina P 11/04/2018, 8:48 AM

## 2018-11-05 ENCOUNTER — Inpatient Hospital Stay (HOSPITAL_COMMUNITY): Payer: BLUE CROSS/BLUE SHIELD

## 2018-11-05 ENCOUNTER — Inpatient Hospital Stay (HOSPITAL_COMMUNITY): Payer: BLUE CROSS/BLUE SHIELD | Admitting: Physical Therapy

## 2018-11-05 ENCOUNTER — Inpatient Hospital Stay (HOSPITAL_COMMUNITY): Payer: BLUE CROSS/BLUE SHIELD | Admitting: Occupational Therapy

## 2018-11-05 LAB — GLUCOSE, CAPILLARY
GLUCOSE-CAPILLARY: 141 mg/dL — AB (ref 70–99)
GLUCOSE-CAPILLARY: 85 mg/dL (ref 70–99)
Glucose-Capillary: 189 mg/dL — ABNORMAL HIGH (ref 70–99)
Glucose-Capillary: 104 mg/dL — ABNORMAL HIGH (ref 70–99)

## 2018-11-05 NOTE — Progress Notes (Signed)
Contacted by PT that pt had experienced knee buckling and was assisted to sit down onto buttocks during therapy session. Pt noted experiencing more sensation of weakness left side and left hand. Had tolerated ambulation in the hallway when suddenly knee gave way. Pt assisted back to room and assisted into bed. No injury noted. Pt assessed by Marissa NestlePam Love PAC, no new orders received, VSS.  Communication between therapy and nursing of event and "weakness" as pt's sensation/awareness of left side improves. Pamelia HoitSharp, Kamaria Lucia B

## 2018-11-05 NOTE — Progress Notes (Signed)
[place] PHYSICAL MEDICINE & REHABILITATION PROGRESS NOTE   Subjective/Complaints: In good spirits. Family ed yesterday. Wife helped with I/O caths. Denies pain except for some discomfort from halo  ROS: Patient denies fever, rash, sore throat, blurred vision, nausea, vomiting, diarrhea, cough, shortness of breath or chest pain, joint or back pain, headache, or mood change.    Dg Chest 2 View  Result Date: 11/04/2018 CLINICAL DATA:  PICC line placement EXAM: CHEST - 2 VIEW COMPARISON:  None. FINDINGS: Right upper extremity catheter tip overlies the distal SVC. No acute opacity or pleural effusion. The heart size is within normal limits. No pneumothorax. Metallic support device over the chest. IMPRESSION: Right upper extremity catheter tip overlies the distal SVC. Electronically Signed   By: Jasmine PangKim  Fujinaga M.D.   On: 11/04/2018 20:22   Recent Labs    11/04/18 0412  WBC 5.8  HGB 10.9*  HCT 37.2*  PLT 380   Recent Labs    11/04/18 0412  NA 141  K 4.2  CL 103  CO2 31  GLUCOSE 153*  BUN 13  CREATININE 0.73  CALCIUM 9.0    Intake/Output Summary (Last 24 hours) at 11/05/2018 0917 Last data filed at 11/05/2018 0430 Gross per 24 hour  Intake 222 ml  Output 2800 ml  Net -2578 ml     Physical Exam: Vital Signs Blood pressure 113/83, pulse 100, temperature 97.7 F (36.5 C), resp. rate (!) 9, height 5' 10.5" (1.791 m), weight 81.6 kg, SpO2 100 %.   Constitutional: No distress . Vital signs reviewed. HEENT: EOMI, oral membranes moist. Right front pin site, sl irritated Neck: supple Cardiovascular: RRR without murmur. No JVD    Respiratory: CTA Bilaterally without wheezes or rales. Normal effort    GI: BS +, non-tender, non-distended  Neurologic: Cranial nerves II through XII intact, motor strength is 5/5 in Right and 4 to 4+/5 Left deltoid, bicep, tricep, grip, hip flexor, knee extensors, ankle dorsiflexor and plantar flexor-stable Senses LT and PP in all 4's.    Musculoskeletal: full ROM  SKIN: sacrum red  Assessment/Plan: 1. 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  Physiatrist is providing close team supervision and 24 hour management of active medical problems listed below.  Physiatrist and rehab team continue to assess barriers to discharge/monitor patient progress toward functional and medical goals  Care Tool:  Bathing  Bathing activity did not occur: Refused Body parts bathed by patient: Left arm, Right arm   Body parts bathed by helper: Buttocks Body parts n/a: Chest, Abdomen   Bathing assist Assist Level: Supervision/Verbal cueing     Upper Body Dressing/Undressing Upper body dressing   What is the patient wearing?: Hospital gown only    Upper body assist Assist Level: Supervision/Verbal cueing    Lower Body Dressing/Undressing Lower body dressing    Lower body dressing activity did not occur: Refused What is the patient wearing?: Pants     Lower body assist Assist for lower body dressing: Supervision/Verbal cueing     Toileting Toileting Toileting Activity did not occur (Clothing management and hygiene only): N/A (no void or bm)  Toileting assist Assist for toileting: Supervision/Verbal cueing     Transfers Chair/bed transfer  Transfers assist     Chair/bed transfer assist level: Supervision/Verbal cueing     Locomotion Ambulation   Ambulation assist      Assist level: Supervision/Verbal cueing Assistive device: Walker-rolling  Max distance: 150'   Walk 10 feet activity   Assist  Walk 10 feet activity did not occur: Safety/medical concerns  Assist level: Supervision/Verbal cueing Assistive device: Walker-rolling   Walk 50 feet activity   Assist Walk 50 feet with 2 turns activity did not occur: Safety/medical concerns  Assist level: Supervision/Verbal  cueing Assistive device: Walker-rolling    Walk 150 feet activity   Assist Walk 150 feet activity did not occur: Safety/medical concerns  Assist level: Supervision/Verbal cueing Assistive device: Walker-rolling    Walk 10 feet on uneven surface  activity   Assist Walk 10 feet on uneven surfaces activity did not occur: Safety/medical concerns   Assist level: Contact Guard/Touching assist Assistive device: Photographer Will patient use wheelchair at discharge?: (TBD) Type of Wheelchair: Manual Wheelchair activity did not occur: Safety/medical concerns  Wheelchair assist level: Contact Guard/Touching assist Max wheelchair distance: 150    Wheelchair 50 feet with 2 turns activity    Assist    Wheelchair 50 feet with 2 turns activity did not occur: Safety/medical concerns   Assist Level: Contact Guard/Touching assist   Wheelchair 150 feet activity     Assist Wheelchair 150 feet activity did not occur: Safety/medical concerns   Assist Level: Contact Guard/Touching assist     Medical Problem List and Plan: 1.  Left-sided weakness and fine motor deficits,, bowel incontinence, urinary retention secondary to cervical myelopathy status post decompression and stabilization with halo.    -Interdisciplinary Team Conference today    -family ed  -ELOS 12/11     2.  DVT Prophylaxis/Anticoagulation: Pharmaceutical: Lovenox 3. Pain Management: Tylenol prn--narcotics have been held due to encephalopathy with hallucinations.   -no pain at present 4. Mood: LCSW to follow for evaluation and support.  5. Neuropsych: This patient is capable of making decisions on his own behalf. 6. Skin/Wound Care: Pin care bid. Monitor incision daily for healing. Maintain adequate nutritional and hydration status.   -continue turning in bed  -air mattress 7. Fluids/Electrolytes/Nutrition: encourage PO    -push fluids, I/O negative yesterday.   8. Epidural  abscess: Continue Ceftriaxone 2 grams every 12 hours with end date 12/24. Weekly CBC/BMET/CRP/Sed rate---on Monday 9. Urinary retention/Neurogenic bladder: Continue proscar and flomax.    -continue I/O caths to keep volumes less than 500cc  -still no sense of filling.   -continue Flomax 0.4 mg nightly  -family/pt ed for I/O caths  -outpt urology follow up  -no urecholine until he has better since of bladder filling 10. Constipation/Neurogenic bowel:  AM bowel program effective 11. Bipolar disorder: has not been taking care of himself for the past few year--started being compliant since mid summer. Team to provide ego support. Continue Zoloft and abilify daily. Atrax prn for anxiety.   -xanax at bedtime for sleep  -mood remains appropriate at present 12. ABLA:11/28 Hgb 9.4---> 9.8 12/2 13. T2DM: Was not taking lantus PTA.   Continue Lantus daily with novolog 8 units breakfast and 10 units bid.  CBG (last 3)  Recent Labs    11/04/18 1622 11/04/18 2125 11/05/18 0626  GLUCAP 147* 108* 189*  Control 11/05/2018. Fair control   14.  Hypoalbuminemia- started resource supplement 15. Hypotension: bp's with improvement,  Vitals:   11/05/18 0436 11/05/18 0442  BP: 121/86 113/83  Pulse: (!) 101 100  Resp: (!) 9   Temp:  97.7 F (36.5 C)  SpO2: 100% 100%     -BUN/Cr   normal 12/9   -  enc fluids   -TED's, acclimation, observe   -consider florinef/midodrine to support bp if needed LOS: 13 days A FACE TO FACE EVALUATION WAS PERFORMED  Ranelle Oyster 11/05/2018, 9:17 AM

## 2018-11-05 NOTE — Plan of Care (Signed)
  Problem: Consults Goal: RH SPINAL CORD INJURY PATIENT EDUCATION Description  See Patient Education module for education specifics.  Outcome: Progressing Goal: Skin Care Protocol Initiated - if Braden Score 18 or less Description If consults are not indicated, leave blank or document N/A Outcome: Progressing Goal: Diabetes Guidelines if Diabetic/Glucose > 140 Description If diabetic or lab glucose is > 140 mg/dl - Initiate Diabetes/Hyperglycemia Guidelines & Document Interventions  Outcome: Progressing   Problem: SCI BOWEL ELIMINATION Goal: RH STG MANAGE BOWEL WITH ASSISTANCE Description STG Manage Bowel with min Assistance.  Outcome: Progressing Goal: RH STG SCI MANAGE BOWEL WITH MEDICATION WITH ASSISTANCE Description STG SCI Manage bowel with medication with min assistance.  Outcome: Progressing Goal: RH STG MANAGE BOWEL W/EQUIPMENT W/ASSISTANCE Description STG Manage Bowel With Equipment With min Assistance  Outcome: Progressing Goal: RH STG SCI MANAGE BOWEL PROGRAM W/ASSIST OR AS APPROPRIATE Description STG SCI Manage bowel program mod w/assist or as appropriate.  Outcome: Progressing   Problem: SCI BLADDER ELIMINATION Goal: RH STG MANAGE BLADDER WITH ASSISTANCE Description STG Manage Bladder With mod Assistance  Outcome: Progressing Goal: RH STG MANAGE BLADDER WITH MEDICATION WITH ASSISTANCE Description STG Manage Bladder With Medication With mod  Assistance.  Outcome: Progressing Goal: RH STG MANAGE BLADDER WITH EQUIPMENT WITH ASSISTANCE Description STG Manage Bladder With Equipment With mod Assistance  Outcome: Progressing Goal: RH STG SCI MANAGE BLADDER PROGRAM W/ASSISTANCE Description Manage bladder program with mod assist   Outcome: Progressing   Problem: RH SKIN INTEGRITY Goal: RH STG SKIN FREE OF INFECTION/BREAKDOWN Description Patients skin will remain free from breakdown or infection with mod assist.  Outcome: Progressing Goal: RH STG MAINTAIN  SKIN INTEGRITY WITH ASSISTANCE Description STG Maintain Skin Integrity With mod Assistance.  Outcome: Progressing Goal: RH STG ABLE TO PERFORM INCISION/WOUND CARE W/ASSISTANCE Description STG Able To Perform Incision/Wound Care With total Assistance from caregiver.  Outcome: Progressing   Problem: RH SAFETY Goal: RH STG ADHERE TO SAFETY PRECAUTIONS W/ASSISTANCE/DEVICE Description STG Adhere to Safety Precautions With mod I Assistance/Device.  Outcome: Progressing Goal: RH STG DECREASED RISK OF FALL WITH ASSISTANCE Description STG Decreased Risk of Fall With mod I Assistance.  Outcome: Progressing   Problem: RH PAIN MANAGEMENT Goal: RH STG PAIN MANAGED AT OR BELOW PT'S PAIN GOAL Description < 3  Outcome: Progressing   Problem: RH KNOWLEDGE DEFICIT SCI Goal: RH STG INCREASE KNOWLEDGE OF SELF CARE AFTER SCI Description Min assist.  Outcome: Progressing

## 2018-11-05 NOTE — Progress Notes (Signed)
Occupational Therapy Session Note  Patient Details  Name: Jonathan Horne MRN: [id number] Date of Birth: December 03, 1960  Today's Date: 11/05/2018 OT Individual Time: 1051-1140 OT Individual Time Calculation (min): 49 min  and Today's Date: 11/05/2018 OT Missed Time: 26 Minutes Missed Time Reason: Patient fatigue   Short Term Goals: Week 2:  OT Short Term Goal 1 (Week 2): STGs=LTGs secondary to upcoming discharge  Skilled Therapeutic Interventions/Progress Updates:    Pt received supine in bed, eager and motivated to participate in therapy. Pt used RW to sit <> stand from EOB with (S). Pt completed 70 ft of functional mobility at (S) level when his L knee suddenly buckled and he was assisted to the floor. Pt reported no pain and was assisted with help of several staff members back to his feet. Pt continued mobility into therapy gym. Pt was provided with extended edu re fall prevention and fall recovery at home. Pt transferred onto mat on floor and practiced fall recovery. Demonstration of fall recovery provided and pt returned demo with CGA. Once sitting EOM pt reported feeling "weird" and all vitals were obtained- BP 129/89, HR was 130 bpm. Pt guided through breathing exercises, but HR maintained tachy. Pt began to report increased L weakness- assessment was completed for s/s of a CVA- none found however still rapidly placed pt back in bed and contacted RN to call PA. PA and RN entered room to complete assessment and determined pt anxious from fall and to continue monitoring. Pt was left supine to rest with NT present, and all needs met.   Therapy Documentation Precautions:  Precautions Precautions: Cervical, Fall, Other (comment), Back Precaution Comments: HALO brace Restrictions Weight Bearing Restrictions: No General: General OT Amount of Missed Time: 26 Minutes Vital Signs: Therapy Vitals Temp: 97.6 F (36.4 C) Temp Source: Oral Pulse Rate: (!) 107 Resp: 20 BP: 121/80 Patient Position  (if appropriate): Lying Oxygen Therapy SpO2: 100 % Pain: Pain Assessment Pain Scale: 0-10 Pain Score: 0-No pain Pain Type: Acute pain Pain Location: Neck Pain Descriptors / Indicators: Spasm;Sore Pain Onset: On-going Patients Stated Pain Goal: 2 Pain Intervention(s): Medication (See eMAR)   Therapy/Group: Individual Therapy  Curtis Sites 11/05/2018, 12:12 PM

## 2018-11-05 NOTE — Progress Notes (Signed)
Physical Therapy Discharge Summary  Patient Details  Name: Jonathan Horne MRN: [id number] Date of Birth: 1961-03-21  Today's Date: 11/05/2018 PT Individual Time:0900  - 1010 PT Minutes: 34 Minutes     Patient has met 5 of 5 long term goals due to improved activity tolerance, improved balance, improved postural control, increased strength, ability to compensate for deficits and improved coordination.  Patient to discharge at an ambulatory level Supervision.   Patient's care partner is independent to provide the necessary physical assistance at discharge.  Reasons goals not met: Pt has met all rehab goals.  Recommendation:  Patient will benefit from ongoing skilled PT services in home health setting to continue to advance safe functional mobility, address ongoing impairments in balance, coordination, strength, safety, and minimize fall risk.  Equipment: RW  Reasons for discharge: treatment goals met and discharge from hospital  Patient/family agrees with progress made and goals achieved: Yes   Skilled Intervention: Pt received supine in bed. Supine to/from sit mod I with use of bedrail. Pt has purchased bedrail for use at home. Sit to stand with Supervision. Ambulation x 150 ft with RW Supervision. Ascend/descend 12 stairs with one handrail and CGA, v/c for safety with LE placement. Car transfer with Supervision. Ambulation across uneven surface with RW and Supervision. Provided HEP handout for patient with BLE strengthening and HS stretches. Pt left seated in bed with needs in reach at end of therapy session.  PT Discharge Precautions/Restrictions Precautions Precautions: Cervical;Fall;Other (comment);Back Precaution Comments: HALO brace Restrictions Weight Bearing Restrictions: No Vital Signs Therapy Vitals Temp: 97.6 F (36.4 C) Temp Source: Oral Pulse Rate: (!) 108 Resp: 20 BP: 116/78 Patient Position (if appropriate): Lying Oxygen Therapy SpO2: 100 % Pain Pain  Assessment Pain Scale: 0-10 Pain Score: 0-No pain Pain Type: Acute pain Vision/Perception  Vision - History Baseline Vision: Wears glasses all the time Perception Perception: Within Functional Limits Praxis Praxis: Intact  Cognition Overall Cognitive Status: Within Functional Limits for tasks assessed Arousal/Alertness: Awake/alert Orientation Level: Oriented X4 Attention: Selective Sustained Attention: Appears intact Selective Attention: Appears intact Memory: Appears intact Awareness: Appears intact Problem Solving: Appears intact Safety/Judgment: Appears intact Sensation Sensation Light Touch: Appears Intact Proprioception: Impaired by gross assessment(ongoing slight impairment L>R, improved from eval) Coordination Gross Motor Movements are Fluid and Coordinated: Yes Fine Motor Movements are Fluid and Coordinated: Yes Coordination and Movement Description: impaired by generalized weakness Heel Shin Test: improved since eval Motor  Motor Motor: Within Functional Limits Motor - Skilled Clinical Observations: generalized weakness  Mobility Bed Mobility Bed Mobility: Rolling Right;Rolling Left;Supine to Sit;Sit to Supine Rolling Right: Independent with assistive device Rolling Left: Independent with assistive device Supine to Sit: Independent with assistive device Sit to Supine: Independent with assistive device Transfers Transfers: Sit to Stand;Stand to Sit;Stand Pivot Transfers;Transfer via Lift Equipment Sit to Stand: Supervision/Verbal cueing Stand to Sit: Supervision/Verbal cueing Stand Pivot Transfers: Supervision/Verbal cueing Stand Pivot Transfer Details: Verbal cues for precautions/safety Transfer (Assistive device): Rolling walker Locomotion  Gait Ambulation: Yes Gait Assistance: Supervision/Verbal cueing Assistive device: Rolling walker Gait Assistance Details: Verbal cues for precautions/safety Gait Gait: Yes Gait Pattern: Within Functional  Limits Gait Pattern: Narrow base of support Gait velocity: decreased Stairs / Additional Locomotion Stairs: Yes Stairs Assistance: Contact Guard/Touching assist Stair Management Technique: One rail Right;Step to pattern;Sideways Number of Stairs: 12 Height of Stairs: 6 Wheelchair Mobility Wheelchair Mobility: No  Trunk/Postural Assessment  Cervical Assessment Cervical Assessment: Exceptions to WFL(ROM limited by HALO) Thoracic Assessment Thoracic Assessment: Exceptions to  WFL(rounded shoulders) Lumbar Assessment Lumbar Assessment: Within Functional Limits Postural Control Postural Control: Within Functional Limits Righting Reactions: improved since eval  Balance Balance Balance Assessed: Yes Static Sitting Balance Static Sitting - Balance Support: Feet supported;No upper extremity supported Static Sitting - Level of Assistance: 6: Modified independent (Device/Increase time) Dynamic Sitting Balance Dynamic Sitting - Balance Support: No upper extremity supported;Feet supported Dynamic Sitting - Level of Assistance: 6: Modified independent (Device/Increase time) Static Standing Balance Static Standing - Balance Support: Bilateral upper extremity supported;During functional activity Static Standing - Level of Assistance: 5: Stand by assistance Extremity Assessment  RUE Assessment RUE Assessment: Within Functional Limits LUE Assessment LUE Assessment: Within Functional Limits RLE Assessment RLE Assessment: Within Functional Limits General Strength Comments: 5/5 grossly LLE Assessment LLE Assessment: Within Functional Limits General Strength Comments: 4+/5 grossly    Excell Seltzer, PT, DPT 11/05/2018, 3:53 PM

## 2018-11-05 NOTE — Patient Care Conference (Signed)
Inpatient RehabilitationTeam Conference and Plan of Care Update Date: 11/05/2018   Time: 2:05 PM    Patient Name: Jonathan Horne      Medical Record Number: [id number]  Date of Birth: 1961-11-21 Sex: Male         Room/Bed: 4W11C/4W11C-01 Payor Info: Payor: [place] / Plan: [employer] OTHER / Product Type: *No Product type* /    Admitting Diagnosis: SCI  Admit Date/Time:  10/23/2018 12:42 PM Admission Comments: No comment available   Primary Diagnosis:  <principal problem not specified> Principal Problem: <principal problem not specified>  Patient Active Problem List   Diagnosis Date Noted  . Cervical myelopathy ([place]) 10/23/2018  . Constipation   . Encephalopathy   . Neurogenic bladder   . Epidural abscess   . Neurogenic bowel   . Bipolar disorder ([place])   . Acute blood loss anemia   . Diabetes mellitus type 2 in nonobese [facility])     Expected Discharge Date: Expected Discharge Date: 11/06/18  Team Members Present: Physician leading conference: Dr. Alger Simons Social Worker Present: Alfonse Alpers, LCSW Nurse Present: Dorien Chihuahua, RN PT Present: Other (comment)(Taylor Ervin Knack, PT) OT Present: Darleen Crocker, OT SLP Present: Windell Moulding, SLP PPS Coordinator present : Daiva Nakayama, RN, CRRN;Melissa Gertie Fey     Current Status/Progress Goal Weekly Team Focus  Medical   Patient stable from a pain standpoint.  Halo is intact without any skin breakdown.  Stabilized medically for discharge  Bowel and bladder management as previously discussed, and out catheterizations   Bowel/Bladder   Bowel and bladder program last BM 11/04/2018  Pt and wife will be educated on in and out cath  continue I&O cath, scheduled dulcolax, and laxatives PRN. Toilet Pt q2h   Swallow/Nutrition/ Hydration             ADL's   supervision overall with use of RW  supervision overall   d/c planning   Mobility   S bed mobility and transfers, S gait up to 150' with RW, CGA to min A stairs  Supervision,  min A stairs  increasing independence with functional mobility, LE strength and coordination   Communication             Safety/Cognition/ Behavioral Observations            Pain   Pt continues to c/o headache releved with PRN Tylenol.  Pain les than 2  assess and address pain qhsift and PRN   Skin   Halo traction in place. pin sites show no s/s of infection. Heels floated while in bed.  Maintain skin integrity free of infection or breakdown.  assess skin qshift and PRN. Pin site care as ordered    Rehab Goals Patient on target to meet rehab goals: Yes Rehab Goals Revised: none *See Care Plan and progress notes for long and short-term goals.     Barriers to Discharge  Current Status/Progress Possible Resolutions Date Resolved   Physician    Medical stability;Neurogenic Bowel & Bladder        Education regarding intermittent catheterizations.  Education regarding pin site care, outpatient urological follow-up      Nursing                  PT                    OT                  SLP  SW                Discharge Planning/Teaching Needs:  Pt plans to return to his home and his wife and son will take turns providing pt with supervision/assistance.  Family education went well this past weekend.   Team Discussion:  Dr. Naaman Plummer feels pt is doing well medically and asked for further information on event today with pt's knee buckling.  He asked that nursing watch pin site on pt's right side.  Pt will need to cath at home and see urology as an outpt.  Pt has met supervision level goals and family education has been completed.  Pt is ready for d/c.  Revisions to Treatment Plan:  none    Continued Need for Acute Rehabilitation Level of Care: The patient requires daily medical management by a physician with specialized training in physical medicine and rehabilitation for the following conditions: Daily direction of a multidisciplinary physical rehabilitation program to  ensure safe treatment while eliciting the highest outcome that is of practical value to the patient.: Yes Daily medical management of patient stability for increased activity during participation in an intensive rehabilitation regime.: Yes Daily analysis of laboratory values and/or radiology reports with any subsequent need for medication adjustment of medical intervention for : Neurological problems;Urological problems   I attest that I was present, lead the team conference, and concur with the assessment and plan of the team.   Emad Brechtel, Silvestre Mesi 11/05/2018, 3:05 PM

## 2018-11-05 NOTE — Progress Notes (Signed)
Occupational Therapy Discharge Summary  Patient Details  Name: Jonathan Horne MRN: [id number] Date of Birth: 1961/09/03  Today's Date: 11/05/2018 OT Individual Time: 2297-9892 OT Individual Time Calculation (min): 58 min    Patient has met 9 of 9 long term goals due to improved activity tolerance, improved balance and ability to compensate for deficits.  Patient to discharge at overall Supervision level.  Patient's care partner is independent to provide the necessary supervision for safety assistance at discharge.    Reasons goals not met: all goals met  Recommendation:  Patient will benefit from ongoing skilled OT services in home health setting to continue to advance functional skills in the area of BADL and iADL.  Equipment: 3 in 1 commode chair  Reasons for discharge: treatment goals met  Patient/family agrees with progress made and goals achieved: Yes   OT Intervention: Upon entering the room, pt supine in bed with no c/o pain. Pt with several questions regarding discharge that were answered by therapist. OT reviewed recommendations for follow up therapy and recommended equipment with pt verbalizing understanding. Pt verbalized step by step instructions for how to get up from floor that he practiced last session in preparation for home. Pt reports he has no further questions this session. OT reviewing HEPs and energy conservation with pt verbalizing understanding. Call bell and all needed items within reach upon exiting the room.   OT Discharge Precautions/Restrictions  Precautions Precautions: Cervical;Fall;Other (comment);Back Precaution Comments: HALO brace Restrictions Weight Bearing Restrictions: No General OT Amount of Missed Time: 26 Minutes Vital Signs Therapy Vitals Temp: 97.6 F (36.4 C) Temp Source: Oral Pulse Rate: (!) 108 Resp: 20 BP: 116/78 Patient Position (if appropriate): Lying Oxygen Therapy SpO2: 100 % Pain Pain Assessment Pain Scale: 0-10 Pain  Score: 0-No pain Pain Type: Acute pain Vision Baseline Vision/History: Wears glasses Wears Glasses: At all times Perception  Perception: Within Functional Limits Praxis Praxis: Intact Cognition Overall Cognitive Status: Within Functional Limits for tasks assessed Arousal/Alertness: Awake/alert Orientation Level: Oriented X4 Attention: Selective Sustained Attention: Appears intact Selective Attention: Appears intact Memory: Appears intact Awareness: Appears intact Problem Solving: Appears intact Safety/Judgment: Appears intact Sensation Sensation Light Touch: Appears Intact Proprioception: Impaired by gross assessment Coordination Gross Motor Movements are Fluid and Coordinated: Yes Fine Motor Movements are Fluid and Coordinated: Yes Coordination and Movement Description: impaired by generalized weakness Heel Shin Test: improved since eval Motor  Motor Motor: Within Functional Limits Motor - Skilled Clinical Observations: generalized weakness Mobility  Bed Mobility Bed Mobility: Rolling Right;Rolling Left;Supine to Sit;Sit to Supine Rolling Right: Independent with assistive device Rolling Left: Independent with assistive device Supine to Sit: Independent with assistive device Sit to Supine: Independent with assistive device Transfers Sit to Stand: Supervision/Verbal cueing Stand to Sit: Supervision/Verbal cueing  Trunk/Postural Assessment  Cervical Assessment Cervical Assessment: Exceptions to [facility]  Balance Balance Balance Assessed: Yes Static Sitting Balance Static Sitting - Balance Support: Feet supported;No upper extremity supported Static Sitting - Level of Assistance: 6: Modified independent (Device/Increase time) Dynamic Sitting Balance Dynamic Sitting - Balance Support: No upper extremity supported;Feet supported Dynamic Sitting - Level of Assistance: 6: Modified independent (Device/Increase time) Static Standing Balance Static Standing - Balance Support:  Bilateral upper extremity supported;During functional activity Static Standing - Level of Assistance: 5: Stand by assistance Extremity/Trunk Assessment RUE Assessment RUE Assessment: Within Functional Limits LUE Assessment LUE Assessment: Within Functional Limits   Gypsy Decant 11/05/2018, 3:13 PM

## 2018-11-06 LAB — GLUCOSE, CAPILLARY: Glucose-Capillary: 176 mg/dL — ABNORMAL HIGH (ref 70–99)

## 2018-11-06 MED ORDER — HYDROCORTISONE ACETATE 25 MG RE SUPP: 25.0000 mg | Freq: Two times a day (BID) | RECTAL | 0 refills | Status: DC | PRN

## 2018-11-06 MED ORDER — SENNOSIDES-DOCUSATE SODIUM 8.6-50 MG PO TABS: 2.0000 | ORAL_TABLET | Freq: Every day | ORAL | 0 refills | Status: AC

## 2018-11-06 MED ORDER — INSULIN ASPART 100 UNIT/ML ~~LOC~~ SOLN: 8.0000 [IU] | Freq: Every day | SUBCUTANEOUS | 11 refills | Status: DC

## 2018-11-06 MED ORDER — ACETAMINOPHEN 325 MG PO TABS: 325.0000 mg | ORAL_TABLET | ORAL | Status: DC | PRN

## 2018-11-06 MED ORDER — FLEET ENEMA 7-19 GM/118ML RE ENEM: 1.0000 | ENEMA | Freq: Every day | RECTAL | 0 refills | Status: AC | PRN

## 2018-11-06 MED ORDER — BACITRACIN-NEOMYCIN-POLYMYXIN OINTMENT TUBE: 1.0000 "application " | TOPICAL_OINTMENT | Freq: Two times a day (BID) | CUTANEOUS | 1 refills | Status: DC

## 2018-11-06 MED ORDER — INSULIN ASPART 100 UNIT/ML CARTRIDGE (PENFILL): 8.0000 [IU] | Freq: Three times a day (TID) | SUBCUTANEOUS | 11 refills | Status: AC

## 2018-11-06 MED ORDER — INSULIN PEN NEEDLE 31G X 6 MM MISC: 1.0000 "application " | Freq: Three times a day (TID) | 0 refills | Status: AC

## 2018-11-06 MED ORDER — SODIUM CHLORIDE 0.9 % IV SOLN: 2.0000 g | Freq: Two times a day (BID) | INTRAVENOUS | Status: DC

## 2018-11-06 MED ORDER — BISACODYL 10 MG RE SUPP: 10.0000 mg | Freq: Every day | RECTAL | 0 refills | Status: AC

## 2018-11-06 MED ORDER — HYDROGEN PEROXIDE 3 % EX SOLN: Freq: Two times a day (BID) | CUTANEOUS | 0 refills | Status: DC

## 2018-11-06 MED ORDER — TAMSULOSIN HCL 0.4 MG PO CAPS: 0.4000 mg | ORAL_CAPSULE | Freq: Every day | ORAL | 0 refills | Status: AC

## 2018-11-06 MED ORDER — NYSTATIN 100000 UNIT/GM EX CREA: TOPICAL_CREAM | Freq: Two times a day (BID) | CUTANEOUS | 0 refills | Status: AC

## 2018-11-06 MED ORDER — HYDROCERIN EX CREA: 1.0000 "application " | TOPICAL_CREAM | Freq: Two times a day (BID) | CUTANEOUS | 0 refills | Status: DC

## 2018-11-06 MED ORDER — FINASTERIDE 5 MG PO TABS: 5.0000 mg | ORAL_TABLET | Freq: Every day | ORAL | 0 refills | Status: AC

## 2018-11-06 MED ORDER — BACITRACIN-POLYMYXIN B 500-10000 UNIT/GM EX OINT: 1.0000 "application " | TOPICAL_OINTMENT | Freq: Two times a day (BID) | CUTANEOUS | 0 refills | Status: AC

## 2018-11-06 MED ORDER — SERTRALINE HCL 100 MG PO TABS: 100.0000 mg | ORAL_TABLET | Freq: Every day | ORAL | 0 refills | Status: DC

## 2018-11-06 MED ORDER — LIDOCAINE HCL URETHRAL/MUCOSAL 2 % EX GEL: CUTANEOUS | 2 refills | Status: DC | PRN

## 2018-11-06 MED ORDER — INSULIN GLARGINE 100 UNIT/ML SOLOSTAR PEN: 28.0000 [IU] | PEN_INJECTOR | Freq: Every day | SUBCUTANEOUS | 0 refills | Status: AC

## 2018-11-06 MED ORDER — SERTRALINE HCL 100 MG PO TABS: 150.0000 mg | ORAL_TABLET | Freq: Every day | ORAL | 0 refills | Status: DC

## 2018-11-06 MED ORDER — LIDOCAINE HCL URETHRAL/MUCOSAL 2 % EX GEL: CUTANEOUS | 2 refills | Status: AC | PRN

## 2018-11-06 MED ORDER — BENEPROTEIN PO POWD: 1.0000 | Freq: Three times a day (TID) | ORAL | 0 refills | Status: AC

## 2018-11-06 MED ORDER — METHOCARBAMOL 750 MG PO TABS: 750.0000 mg | ORAL_TABLET | Freq: Four times a day (QID) | ORAL | 0 refills | Status: AC | PRN

## 2018-11-06 NOTE — Discharge Summary (Signed)
Physician Discharge Summary  Patient ID: Jonathan MatteMark T Patella MRN: [id number] DOB/AGE: 57/01/1961 58 y.o.  Admit date: 10/23/2018 Discharge date: 11/06/2018  Discharge Diagnoses:  Principal Problem:   Cervical myelopathy (HCC) Active Problems:   Constipation   Neurogenic bladder   Epidural abscess   Neurogenic bowel   Bipolar disorder (HCC)   Acute blood loss anemia   Diabetes mellitus type 2 in nonobese [facility](HCC)   Discharged Condition: stable   Significant Diagnostic Studies: Dg Chest 2 View  Result Date: 11/04/2018 CLINICAL DATA:  PICC line placement EXAM: CHEST - 2 VIEW COMPARISON:  None. FINDINGS: Right upper extremity catheter tip overlies the distal SVC. No acute opacity or pleural effusion. The heart size is within normal limits. No pneumothorax. Metallic support device over the chest. IMPRESSION: Right upper extremity catheter tip overlies the distal SVC. Electronically Signed   By: Jasmine PangKim  Fujinaga M.D.   On: 11/04/2018 20:22   Dg Abd Portable 1v  Result Date: 10/23/2018 CLINICAL DATA:  Constipation EXAM: PORTABLE ABDOMEN - 1 VIEW COMPARISON:  None. FINDINGS: Nonobstructed bowel gas pattern with large amount of stool in the colon. No radiopaque calculi. Probable phleboliths in the pelvis. IMPRESSION: Nonobstructed gas pattern with large volume of stool in the colon Electronically Signed   By: Jasmine PangKim  Fujinaga M.D.   On: 10/23/2018 15:07   Vas [place] Lower Extremity Venous (dvt)  Result Date: 10/25/2018  Lower Venous Study Performing Technologist: Levada Schillingharlotte Bynum RDMS, RVT  Examination Guidelines: A complete evaluation includes B-mode imaging, spectral Doppler, color Doppler, and power Doppler as needed of all accessible portions of each vessel. Bilateral testing is considered an integral part of a complete examination. Limited examinations for reoccurring indications may be performed as noted.  Right Venous Findings: +---------+---------------+---------+-----------+----------+-------+           CompressibilityPhasicitySpontaneityPropertiesSummary +---------+---------------+---------+-----------+----------+-------+ CFV      Full           Yes      Yes                          +---------+---------------+---------+-----------+----------+-------+ SFJ      Full                                                 +---------+---------------+---------+-----------+----------+-------+ FV Prox  Full                                                 +---------+---------------+---------+-----------+----------+-------+ FV Mid   Full                                                 +---------+---------------+---------+-----------+----------+-------+ FV DistalFull                                                 +---------+---------------+---------+-----------+----------+-------+ PFV      Full                                                 +---------+---------------+---------+-----------+----------+-------+  POP      Full           Yes      Yes                          +---------+---------------+---------+-----------+----------+-------+ PTV      Full                                                 +---------+---------------+---------+-----------+----------+-------+ PERO     Full                                                 +---------+---------------+---------+-----------+----------+-------+ GSV      Full                                                 +---------+---------------+---------+-----------+----------+-------+  Left Venous Findings: +---------+---------------+---------+-----------+----------+-------+          CompressibilityPhasicitySpontaneityPropertiesSummary +---------+---------------+---------+-----------+----------+-------+ CFV      Full           Yes      Yes                          +---------+---------------+---------+-----------+----------+-------+ SFJ      Full                                                  +---------+---------------+---------+-----------+----------+-------+ FV Prox  Full                                                 +---------+---------------+---------+-----------+----------+-------+ FV Mid   Full                                                 +---------+---------------+---------+-----------+----------+-------+ FV DistalFull                                                 +---------+---------------+---------+-----------+----------+-------+ PFV      Full                                                 +---------+---------------+---------+-----------+----------+-------+ POP      Full           Yes      Yes                          +---------+---------------+---------+-----------+----------+-------+  PTV      Full                                                 +---------+---------------+---------+-----------+----------+-------+ PERO     Full                                                 +---------+---------------+---------+-----------+----------+-------+ GSV      Full                                                 +---------+---------------+---------+-----------+----------+-------+    Summary: Right: There is no evidence of deep vein thrombosis in the lower extremity. No cystic structure found in the popliteal fossa. Left: There is no evidence of deep vein thrombosis in the lower extremity. No cystic structure found in the popliteal fossa.  *See table(s) above for measurements and observations. Electronically signed by Lemar Livings MD on 10/25/2018 at 3:37:02 PM.    Final     Labs:  Basic Metabolic Panel: BMP Latest Ref Rng & Units 11/04/2018 10/28/2018 10/24/2018  Glucose 70 - 99 mg/dL 132(G) 401(U) 272(Z)  BUN 6 - 20 mg/dL 13 16 13   Creatinine 0.61 - 1.24 mg/dL 3.66 4.40 3.47  Sodium 135 - 145 mmol/L 141 138 137  Potassium 3.5 - 5.1 mmol/L 4.2 3.9 3.8  Chloride 98 - 111 mmol/L 103 100 102  CO2 22 - 32 mmol/L 31 33(H) 27  Calcium  8.9 - 10.3 mg/dL 9.0 4.2(V) 8.3(L)    CBC: CBC Latest Ref Rng & Units 11/04/2018 10/28/2018 10/24/2018  WBC 4.0 - 10.5 K/uL 5.8 6.1 6.2  Hemoglobin 13.0 - 17.0 g/dL 10.9(L) 9.8(L) 9.4(L)  Hematocrit 39.0 - 52.0 % 37.2(L) 32.6(L) 31.8(L)  Platelets 150 - 400 K/uL 380 430(H) 488(H)    CBG: Recent Labs  Lab 11/05/18 0626 11/05/18 1136 11/05/18 1633 11/05/18 2136 11/06/18 1121  GLUCAP 189* 141* 85 104* 176*    Brief HPI:   BRAISON SNOKE is a 57 year old male with history of T2 DM, bipolar disorder, medical noncompliance who was recently admitted to outside hospital with dysphagia and strep UTI 1027 211/11/2017.  He was found to have T7-T8 streptococcal osteomyelitis with paraspinal abscess and was discharged home on 3 weeks course of antibiotics.  He continued to have severe neck pain progressing to LUE and LLE weakness with numbness as well as mental status changes with hallucinations.  He was readmitted to [facility] on 10/07/2018 for work-up and was found to have progression of epidural abscess with compression of upper cervical cord with increased signal at atlanto occipital articulation with suspicion of instability as well as inflammatory changes C1/C2 with concerns of developing abscess.    He was taken to the OR for C1-C3 laminectomy and decompression of spinal cord on 10/11/2018 by Dr. Laury Deep and halo placed for stabilization.  Antibiotic adjusted to IV ceftriaxone 2 mg twice daily with end date 11/19/18.  Hospital course has been significant for encephalopathy with hallucination, daytime sedation therefore multiple home medications were DC'd.  He has had issues with constipation due to  neurogenic bowel and urinary retention due to neurogenic bladder. Post op with some improvement in LUE strength and coordination however continues to have limitations in mobility and self-care tasks.  CIR recommended to follow therapy   Hospital Course: KRISHAWN VANDERWEELE was admitted to rehab 10/23/2018 for  inpatient therapies to consist of PT and OT at least three hours five days a week. Past admission physiatrist, therapy team and rehab RN have worked together to provide customized collaborative inpatient rehab.  He was maintained on IV ceftriaxone twice daily and has been afebrile during his stay.  Posterior neck incision is C/D/I and is healing without any signs or symptoms of infection.  Pin sites show mild irritation but no drainage or signs of infection.  He was maintained on subcu Lovenox for DVT prophylaxis and BLE Dopplers were negative for DVT.  Protein supplements were added to help promote wound healing however he has been refusing this frequently.  Mood has been stable on Abilify and lower dose Zoloft.  He was advised to continue current medication changes and follow-up with psychiatry past discharge for monitoring.  Serial labs done revealing mild elevation in ALT.  CBC shows improvement in ABLA and WBC/platelets are stable.  Sed rate is coming down from 68-60.  Hemoglobin A1c at admission which was 8.8 and patient was educated on importance of dietary discretion as well as medication compliance.  Blood sugars have been monitored on AC/HS basis and he continues on Lantus 28 units with meal coverage 3 times daily.  He is to follow-up with primary care for further adjustment in his diabetic regimen.  KUB at admission showed large volume stool throughout the colon and he was started on a bowel program with 2 senna at bedtime followed by suppository in the morning.  Voiding was monitored with PVR checks and he has been cath every 4-6 hours to keep volumes below 350 cc.  Patient and wife have been educated on continuing catheterizations past discharge and referral has been made to urology at [facility].  They will contact patient with date and time of appointment.  Pain has been controlled with as needed use of Tylenol.  He was instructed to be cautious with use of Robaxin for shoulder and neck pain at bedtime if  needed.  He has made steady progress during his stay and is currently at supervision level.  He will continue to receive follow-up home health PT OT and RN by [facility].  IV Rocephin to be managed by [facility].   Rehab course: During patient's stay in rehab weekly team conferences were held to monitor patient's progress, set goals and discuss barriers to discharge. At admission, patient required mod to total assist with basic ADL task and mod assist with mobility. He  has had improvement in activity tolerance, balance, postural control as well as ability to compensate for deficits. He has had improvement in functional use LUE  and LLE as well as improvement in awareness.  He requires supervision with ADL tasks.  He requires supervision with verbal cues for transfers and to ambulate 150 feet with rolling walker.  Family education was done regarding all aspects of care, B&B program as well as safety.    Disposition:  Home    Diet: Regular   Special Instructions: 1. IV ceftriaxone 2 mg bid. HHRN to draw weekly labs (BMET, CBC, Sed rate and C reactive protein) every Monday with results to ID clinic at  [facility].  2. Cleanse pin sites bid  with 1:1 solution of hydrogen peroxide and water. Pat dry and apply a layer of polymycin ointment.  3. Keep record of fluid intake. Cath every 4-6 hours to keep bladder volumes < 350 cc. Urology to contact you with follow up appointment.  4. Need to continue suppository every am for bowel program. May need to adjust further once antibiotics discontinued.   5. Contact ID clinic for follow up appointment prior to d/c antibiotics.    Discharge Instructions    Ambulatory referral to Physical Medicine Rehab   Complete by:  As directed    1-2 weeks transitional care appt     Allergies as of 11/06/2018      Reactions   Lisinopril    cough   Reglan [metoclopramide]    Tremors   Tramadol Other (See Comments)   hallucinations      Medication List     STOP taking these medications   ADDERALL XR 25 MG 24 hr capsule Generic drug:  amphetamine-dextroamphetamine   ASPIRIN LOW DOSE 81 MG EC tablet Generic drug:  aspirin   docusate sodium 100 MG capsule Commonly known as:  COLACE   insulin lispro 100 UNIT/ML injection Commonly known as:  HUMALOG   LANTUS 100 UNIT/ML injection Generic drug:  insulin glargine Replaced by:  Insulin Glargine 100 UNIT/ML Solostar Pen   losartan 25 MG tablet Commonly known as:  COZAAR   Melatonin 3 MG Tabs   metFORMIN 1000 MG tablet Commonly known as:  GLUCOPHAGE   NOVOLOG 100 UNIT/ML injection Generic drug:  insulin aspart Replaced by:  insulin aspart cartridge   oxyCODONE 5 MG immediate release tablet Commonly known as:  Oxy IR/ROXICODONE     TAKE these medications   acetaminophen 325 MG tablet Commonly known as:  TYLENOL Take 1-2 tablets (325-650 mg total) by mouth every 4 (four) hours as needed for mild pain. What changed:    how much to take  when to take this  reasons to take this   ALPRAZolam 0.5 MG tablet Commonly known as:  XANAX Take 0.5 mg by mouth at bedtime as needed for anxiety or sleep.   ARIPiprazole 15 MG tablet Commonly known as:  ABILIFY Take 15 mg by mouth daily.   atorvastatin 80 MG tablet Commonly known as:  LIPITOR Take 80 mg by mouth daily.   bacitracin-polymyxin b ointment Commonly known as:  POLYSPORIN Apply 1 application topically 2 (two) times daily for 10 days. Apply around halo pins   bisacodyl 10 MG suppository Commonly known as:  DULCOLAX Place 1 suppository (10 mg total) rectally daily at 6 (six) AM. Start taking on:  11/07/2018   cefTRIAXone 2 g in sodium chloride 0.9 % 100 mL Inject 2 g into the vein every 12 (twelve) hours.   finasteride 5 MG tablet Commonly known as:  PROSCAR Take 1 tablet (5 mg total) by mouth daily.   hydrocerin Crea Apply 1 application topically 2 (two) times daily.   hydrocortisone 25 MG  suppository Commonly known as:  ANUSOL-HC Place 1 suppository (25 mg total) rectally 2 (two) times daily as needed for hemorrhoids or anal itching.   hydrogen peroxide 3 % external solution Apply topically 2 (two) times daily.   insulin aspart cartridge Commonly known as:  NOVOLOG Inject 8-10 Units into the skin 3 (three) times daily with meals. Take 8 units with breakfast. Take 10 units with lunch and supper. Replaces:  NOVOLOG 100 UNIT/ML injection   Insulin Glargine 100 UNIT/ML Solostar Pen Commonly known  as:  LANTUS Inject 28 Units into the skin daily. Replaces:  LANTUS 100 UNIT/ML injection   Insulin Pen Needle 31G X 6 MM Misc 1 application by Does not apply route 3 (three) times daily with meals.   lidocaine 2 % jelly Commonly known as:  XYLOCAINE Apply topically as needed (Use with in and out catheter).   methocarbamol 750 MG tablet Commonly known as:  ROBAXIN Take 1 tablet (750 mg total) by mouth every 6 (six) hours as needed for muscle spasms (neck/shoulder pain).   nystatin cream Commonly known as:  MYCOSTATIN Apply topically 2 (two) times daily.   protein supplement Powd Take 6 g by mouth 3 (three) times daily with meals.   senna-docusate 8.6-50 MG tablet Commonly known as:  Senokot-S Take 2 tablets by mouth at bedtime.   sertraline 100 MG tablet Commonly known as:  ZOLOFT Take 1 tablet (100 mg total) by mouth daily. What changed:  how much to take   sodium phosphate 7-19 GM/118ML Enem Place 133 mLs (1 enema total) rectally daily as needed for severe constipation.   tamsulosin 0.4 MG Caps capsule Commonly known as:  FLOMAX Take 1 capsule (0.4 mg total) by mouth daily.      Follow-up Information    Ranelle Oyster, MD Follow up.   Specialty:  Physical Medicine and Rehabilitation Why:  Office will call you with follow up appointment Contact information: [address] [place] [address] [phone]        Neldon Labella,  MD Follow up on 11/25/2018.   Specialty:  Neurosurgery Contact information: [address] [facility] [place] [address] [phone]        Cory Roughen, PA-C. Call.   Specialty:  Internal Medicine Why:  for post hospital follow up in 1-2 weeks Contact information: [address] [place]. [place] [address] [phone]           Signed: Jacquelynn Cree 11/06/2018, 12:44 PM

## 2018-11-06 NOTE — Progress Notes (Signed)
Social Work Patient ID: Jonathan Horne, male   DOB: Oct 12, 1961, 57 y.o.   MRN: [id number]   Met with pt to update him after conference and spoke/texted with pt's wife who is prepared for him to be at home.  Pt is ready to go home and therapy team and MD/RN concur.  CSW has ordered DME and arranged HH and infusion of antibiotic.  CSW will continue to follow and assist as needed.

## 2018-11-06 NOTE — Progress Notes (Signed)
Social Work Discharge Note  The overall goal for the admission was met for:   Discharge location: Yes - home with family  Length of Stay: Yes - 14 days  Discharge activity level: Yes - supervision  Home/community participation: Yes  Services provided included: MD, RD, PT, OT, RN, Pharmacy, Neuropsych and SW  Financial Services: Private Insurance: [employer]  Follow-up services arranged: Home Health: PT/OT/RN from [place], DME: rolling walker and bedside commode from [place], Other: [facility] for IV rocephin and Patient/Family has no preference for HH/DME agencies  Comments (or additional information): Pt's wife has received family education and feels prepared to care for pt at home.  Patient/Family verbalized understanding of follow-up arrangements: Yes  Individual responsible for coordination of the follow-up plan: pt and his wife  Confirmed correct DME delivered: Trey Sailors 11/06/2018    Graviela Nodal, Silvestre Mesi

## 2018-11-06 NOTE — Discharge Instructions (Signed)
Inpatient Rehab Discharge Instructions  Jonathan Horne Discharge date and time:  11/06/18  Activities/Precautions/ Functional Status: Activity: activity asLajuana Matte tolerated and no driving till cleared by MD Diet: diabetic diet Wound Care: Cleanse pin sites with 1:1 solution of peroxide and water twice a day. Then apply a layer of neosporin antibiotic ointment around the sites. .  Functional status:  ___ No restrictions     ___ Walk up steps independently _X__ 24/7 supervision/assistance   ___ Walk up steps with assistance ___ Intermittent supervision/assistance  ___ Bathe/dress independently ___ Walk with walker     _X__ Bathe/dress with assistance ___ Walk Independently    ___ Shower independently ___ Walk with assistance    ___ Shower with assistance _X__ No alcohol     ___ Return to work/school ________   COMMUNITY REFERRALS UPON DISCHARGE:   Home Health:   PT     OT      RN    [facility] Phone:  [phone] Medical Equipment/Items Ordered:  Rolling walker and bedside  Agency/Supplier:  [facility]       Phone:  [phone] Other:  [facility]            Phone:  [phone]   Special Instructions: 1. Pin care twice a day--Contact MD if you develop any problems at pin sites--increase in redness, swelling, increase in pain, drainage or if you develop fever or chills.  2. [facility] has [facility] referral from November hospitalization. They do not have an appointment before Feb but will check their schedule and try to work you in. They will contact you with date and time.  3. Monitor blood sugars before meals and at bedtime. Novolog meal coverage to be taken before meals. Do not take it if blood sugar is below 80 or if you are not planning on eating at least 50% of your meal.  4. Need to cath yourself every every 4-6 hours to keep bladder volumes less 350 cc.     My questions have been answered and I understand these instructions. I will  adhere to these goals and the provided educational materials after my discharge from the hospital.  Patient/Caregiver Signature _______________________________ Date __________  Clinician Signature _______________________________________ Date __________  Please bring this form and your medication list with you to all your follow-up doctor's appointments.

## 2018-11-06 NOTE — Plan of Care (Signed)
  Problem: Consults Goal: RH SPINAL CORD INJURY PATIENT EDUCATION Description  See Patient Education module for education specifics.  Outcome: Completed/Met Goal: Skin Care Protocol Initiated - if Braden Score 18 or less Description If consults are not indicated, leave blank or document N/A Outcome: Completed/Met Goal: Diabetes Guidelines if Diabetic/Glucose > 140 Description If diabetic or lab glucose is > 140 mg/dl - Initiate Diabetes/Hyperglycemia Guidelines & Document Interventions  Outcome: Completed/Met   Problem: SCI BOWEL ELIMINATION Goal: RH STG MANAGE BOWEL WITH ASSISTANCE Description STG Manage Bowel with min Assistance.  Outcome: Completed/Met Goal: RH STG SCI MANAGE BOWEL WITH MEDICATION WITH ASSISTANCE Description STG SCI Manage bowel with medication with min assistance.  Outcome: Completed/Met Goal: RH STG MANAGE BOWEL W/EQUIPMENT W/ASSISTANCE Description STG Manage Bowel With Equipment With min Assistance  Outcome: Completed/Met Goal: RH STG SCI MANAGE BOWEL PROGRAM W/ASSIST OR AS APPROPRIATE Description STG SCI Manage bowel program mod w/assist or as appropriate.  Outcome: Completed/Met   Problem: SCI BLADDER ELIMINATION Goal: RH STG MANAGE BLADDER WITH ASSISTANCE Description STG Manage Bladder With mod Assistance  Outcome: Completed/Met Goal: RH STG MANAGE BLADDER WITH MEDICATION WITH ASSISTANCE Description STG Manage Bladder With Medication With mod  Assistance.  Outcome: Completed/Met Goal: RH STG MANAGE BLADDER WITH EQUIPMENT WITH ASSISTANCE Description STG Manage Bladder With Equipment With mod Assistance  Outcome: Completed/Met Goal: RH STG SCI MANAGE BLADDER PROGRAM W/ASSISTANCE Description Manage bladder program with mod assist   Outcome: Completed/Met   Problem: RH SKIN INTEGRITY Goal: RH STG SKIN FREE OF INFECTION/BREAKDOWN Description Patients skin will remain free from breakdown or infection with mod assist.  Outcome:  Completed/Met Goal: RH STG MAINTAIN SKIN INTEGRITY WITH ASSISTANCE Description STG Maintain Skin Integrity With mod Assistance.  Outcome: Completed/Met Goal: RH STG ABLE TO PERFORM INCISION/WOUND CARE W/ASSISTANCE Description STG Able To Perform Incision/Wound Care With total Assistance from caregiver.  Outcome: Completed/Met   Problem: RH SAFETY Goal: RH STG ADHERE TO SAFETY PRECAUTIONS W/ASSISTANCE/DEVICE Description STG Adhere to Safety Precautions With mod I Assistance/Device.  Outcome: Completed/Met Goal: RH STG DECREASED RISK OF FALL WITH ASSISTANCE Description STG Decreased Risk of Fall With mod I Assistance.  Outcome: Completed/Met   Problem: RH PAIN MANAGEMENT Goal: RH STG PAIN MANAGED AT OR BELOW PT'S PAIN GOAL Description < 3  Outcome: Completed/Met   Problem: RH KNOWLEDGE DEFICIT SCI Goal: RH STG INCREASE KNOWLEDGE OF SELF CARE AFTER SCI Description Min assist.  Outcome: Completed/Met

## 2018-11-06 NOTE — Progress Notes (Signed)
[place] PHYSICAL MEDICINE & REHABILITATION PROGRESS NOTE   Subjective/Complaints: No new complaints. Left leg feeling better today. Gave away in therapy yesterday---"freaked" him out a bit,. Excited to be going home  ROS: Patient denies fever, rash, sore throat, blurred vision, nausea, vomiting, diarrhea, cough, shortness of breath or chest pain, joint or back pain, headache, or mood change.      Dg Chest 2 View  Result Date: 11/04/2018 CLINICAL DATA:  PICC line placement EXAM: CHEST - 2 VIEW COMPARISON:  None. FINDINGS: Right upper extremity catheter tip overlies the distal SVC. No acute opacity or pleural effusion. The heart size is within normal limits. No pneumothorax. Metallic support device over the chest. IMPRESSION: Right upper extremity catheter tip overlies the distal SVC. Electronically Signed   By: Jasmine Pang M.D.   On: 11/04/2018 20:22   Recent Labs    11/04/18 0412  WBC 5.8  HGB 10.9*  HCT 37.2*  PLT 380   Recent Labs    11/04/18 0412  NA 141  K 4.2  CL 103  CO2 31  GLUCOSE 153*  BUN 13  CREATININE 0.73  CALCIUM 9.0    Intake/Output Summary (Last 24 hours) at 11/06/2018 0930 Last data filed at 11/06/2018 0900 Gross per 24 hour  Intake 720 ml  Output 3275 ml  Net -2555 ml     Physical Exam: Vital Signs Blood pressure 122/89, pulse 93, temperature 98.1 F (36.7 C), resp. rate 18, height 5' 10.5" (1.791 m), weight 81.6 kg, SpO2 100 %.   Constitutional: No distress . Vital signs reviewed. HEENT: EOMI, oral membranes moist Neck: supple Cardiovascular: RRR without murmur. No JVD    Respiratory: CTA Bilaterally without wheezes or rales. Normal effort    GI: BS +, non-tender, non-distended  Neurologic: Cranial nerves II through XII intact, motor strength is 5/5 in Right and 4 to 4+/5 Left deltoid, bicep, tricep, grip, hip flexor, knee extensors, ankle dorsiflexor and plantar flexor-stable Senses LT and PP in all 4's.   Musculoskeletal: full  ROM  SKIN: sacrum red  Assessment/Plan: 1. 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  Physiatrist is providing close team supervision and 24 hour management of active medical problems listed below.  Physiatrist and rehab team continue to assess barriers to discharge/monitor patient progress toward functional and medical goals  Care Tool:  Bathing  Bathing activity did not occur: Refused Body parts bathed by patient: Left arm, Right arm   Body parts bathed by helper: Buttocks Body parts n/a: Chest, Abdomen   Bathing assist Assist Level: Supervision/Verbal cueing     Upper Body Dressing/Undressing Upper body dressing   What is the patient wearing?: Hospital gown only    Upper body assist Assist Level: Supervision/Verbal cueing    Lower Body Dressing/Undressing Lower body dressing    Lower body dressing activity did not occur: Refused What is the patient wearing?: Pants     Lower body assist Assist for lower body dressing: Supervision/Verbal cueing     Toileting Toileting Toileting Activity did not occur (Clothing management and hygiene only): N/A (no void or bm)  Toileting assist Assist for toileting: Supervision/Verbal cueing     Transfers Chair/bed transfer  Transfers assist     Chair/bed transfer assist level: Supervision/Verbal cueing     Locomotion Ambulation   Ambulation assist      Assist level: Supervision/Verbal cueing Assistive device: Walker-rolling Max distance:  150'   Walk 10 feet activity   Assist  Walk 10 feet activity did not occur: Safety/medical concerns  Assist level: Supervision/Verbal cueing Assistive device: Walker-rolling   Walk 50 feet activity   Assist Walk 50 feet with 2 turns activity did not occur: Safety/medical concerns  Assist level: Supervision/Verbal cueing Assistive  device: Walker-rolling    Walk 150 feet activity   Assist Walk 150 feet activity did not occur: Safety/medical concerns  Assist level: Supervision/Verbal cueing Assistive device: Walker-rolling    Walk 10 feet on uneven surface  activity   Assist Walk 10 feet on uneven surfaces activity did not occur: Safety/medical concerns   Assist level: Supervision/Verbal cueing Assistive device: Photographer Will patient use wheelchair at discharge?: No Type of Wheelchair: Manual Wheelchair activity did not occur: Safety/medical concerns  Wheelchair assist level: Contact Guard/Touching assist Max wheelchair distance: 150    Wheelchair 50 feet with 2 turns activity    Assist    Wheelchair 50 feet with 2 turns activity did not occur: Safety/medical concerns   Assist Level: Contact Guard/Touching assist   Wheelchair 150 feet activity     Assist Wheelchair 150 feet activity did not occur: Safety/medical concerns   Assist Level: Contact Guard/Touching assist     Medical Problem List and Plan: 1.  Left-sided weakness and fine motor deficits,, bowel incontinence, urinary retention secondary to cervical myelopathy status post decompression and stabilization with halo.    -dc home today  -Patient to see Rehab MD/provider in the office for transitional care encounter in 1-2 weeks.    2.  DVT Prophylaxis/Anticoagulation: Pharmaceutical: Lovenox 3. Pain Management: Tylenol prn--narcotics have been held due to encephalopathy with hallucinations.   -no pain at present 4. Mood: LCSW to follow for evaluation and support.  5. Neuropsych: This patient is capable of making decisions on his own behalf. 6. Skin/Wound Care: Pin care bid. Monitor incision daily for healing. Maintain adequate nutritional and hydration status.   -continue turning in bed  -air mattress 7. Fluids/Electrolytes/Nutrition: encourage PO    -push fluids, I/O negative yesterday.    8. Epidural abscess: Continue Ceftriaxone 2 grams every 12 hours with end date 12/24. Weekly CBC/BMET/CRP/Sed rate---on Monday 9. Urinary retention/Neurogenic bladder: Continue proscar and flomax.    -continue I/O caths to keep volumes less than 500cc  -still no sense of filling.   -continue Flomax 0.4 mg nightly  -family/pt ed for I/O caths  -outpt urology follow up--try to arrange at [facility]  -no urecholine until he has better since of bladder filling 10. Constipation/Neurogenic bowel:  AM bowel program effective 11. Bipolar disorder: has not been taking care of himself for the past few year--started being compliant since mid summer. Team to provide ego support. Continue Zoloft and abilify daily. Atrax prn for anxiety.   -xanax at bedtime for sleep  -mood remains appropriate at present 12. ABLA:11/28 Hgb 9.4---> 9.8 12/2 13. T2DM: Was not taking lantus PTA.   Continue Lantus daily with novolog 8 units breakfast and 10 units bid.  CBG (last 3)  Recent Labs    11/05/18 1136 11/05/18 1633 11/05/18 2136  GLUCAP 141* 85 104*   11/06/2018. Fair control   14.  Hypoalbuminemia- started resource supplement 15. Hypotension: bp's with improvement.  Vitals:   11/06/18 0550 11/06/18 0551  BP: 122/89   Pulse: 92 93  Resp:    Temp: 98.3 F (36.8 C) 98.1 F (36.7 C)  SpO2: 100% 100%     -  BUN/Cr   normal 12/11   -enc fluids   -TED's, acclimation, observe   -consider florinef/midodrine to support bp if needed LOS: 14 days A FACE TO FACE EVALUATION WAS PERFORMED  Ranelle OysterZachary T Kalel Harty 11/06/2018, 9:30 AM

## 2018-11-08 ENCOUNTER — Telehealth: Payer: Self-pay | Admitting: Registered Nurse

## 2018-11-08 LAB — GLUCOSE, CAPILLARY: Glucose-Capillary: 85 mg/dL (ref 70–99)

## 2018-11-08 NOTE — Telephone Encounter (Signed)
Placed a call to Mr. Jonathan Horne, the number listed is disconnected. Will send an e-mail to Jonathan Horne, to inquire if she has another number listed for Mr. Jonathan Horne.

## 2018-11-08 NOTE — Telephone Encounter (Signed)
Placed a call to [phone], no answer. Left message to return the call.

## 2018-11-11 NOTE — Telephone Encounter (Signed)
Left message at [phone] for pt to call back

## 2018-11-13 ENCOUNTER — Encounter
Payer: BLUE CROSS/BLUE SHIELD | Attending: Physical Medicine & Rehabilitation | Admitting: Physical Medicine & Rehabilitation

## 2019-03-19 IMAGING — DX DG CHEST 2V
2 series · 2 of 2 positions shown · non-contrast
Comparison: None.

CLINICAL DATA: PICC line placement

EXAM:
CHEST - 2 VIEW

[chest lat (1 of 2)]
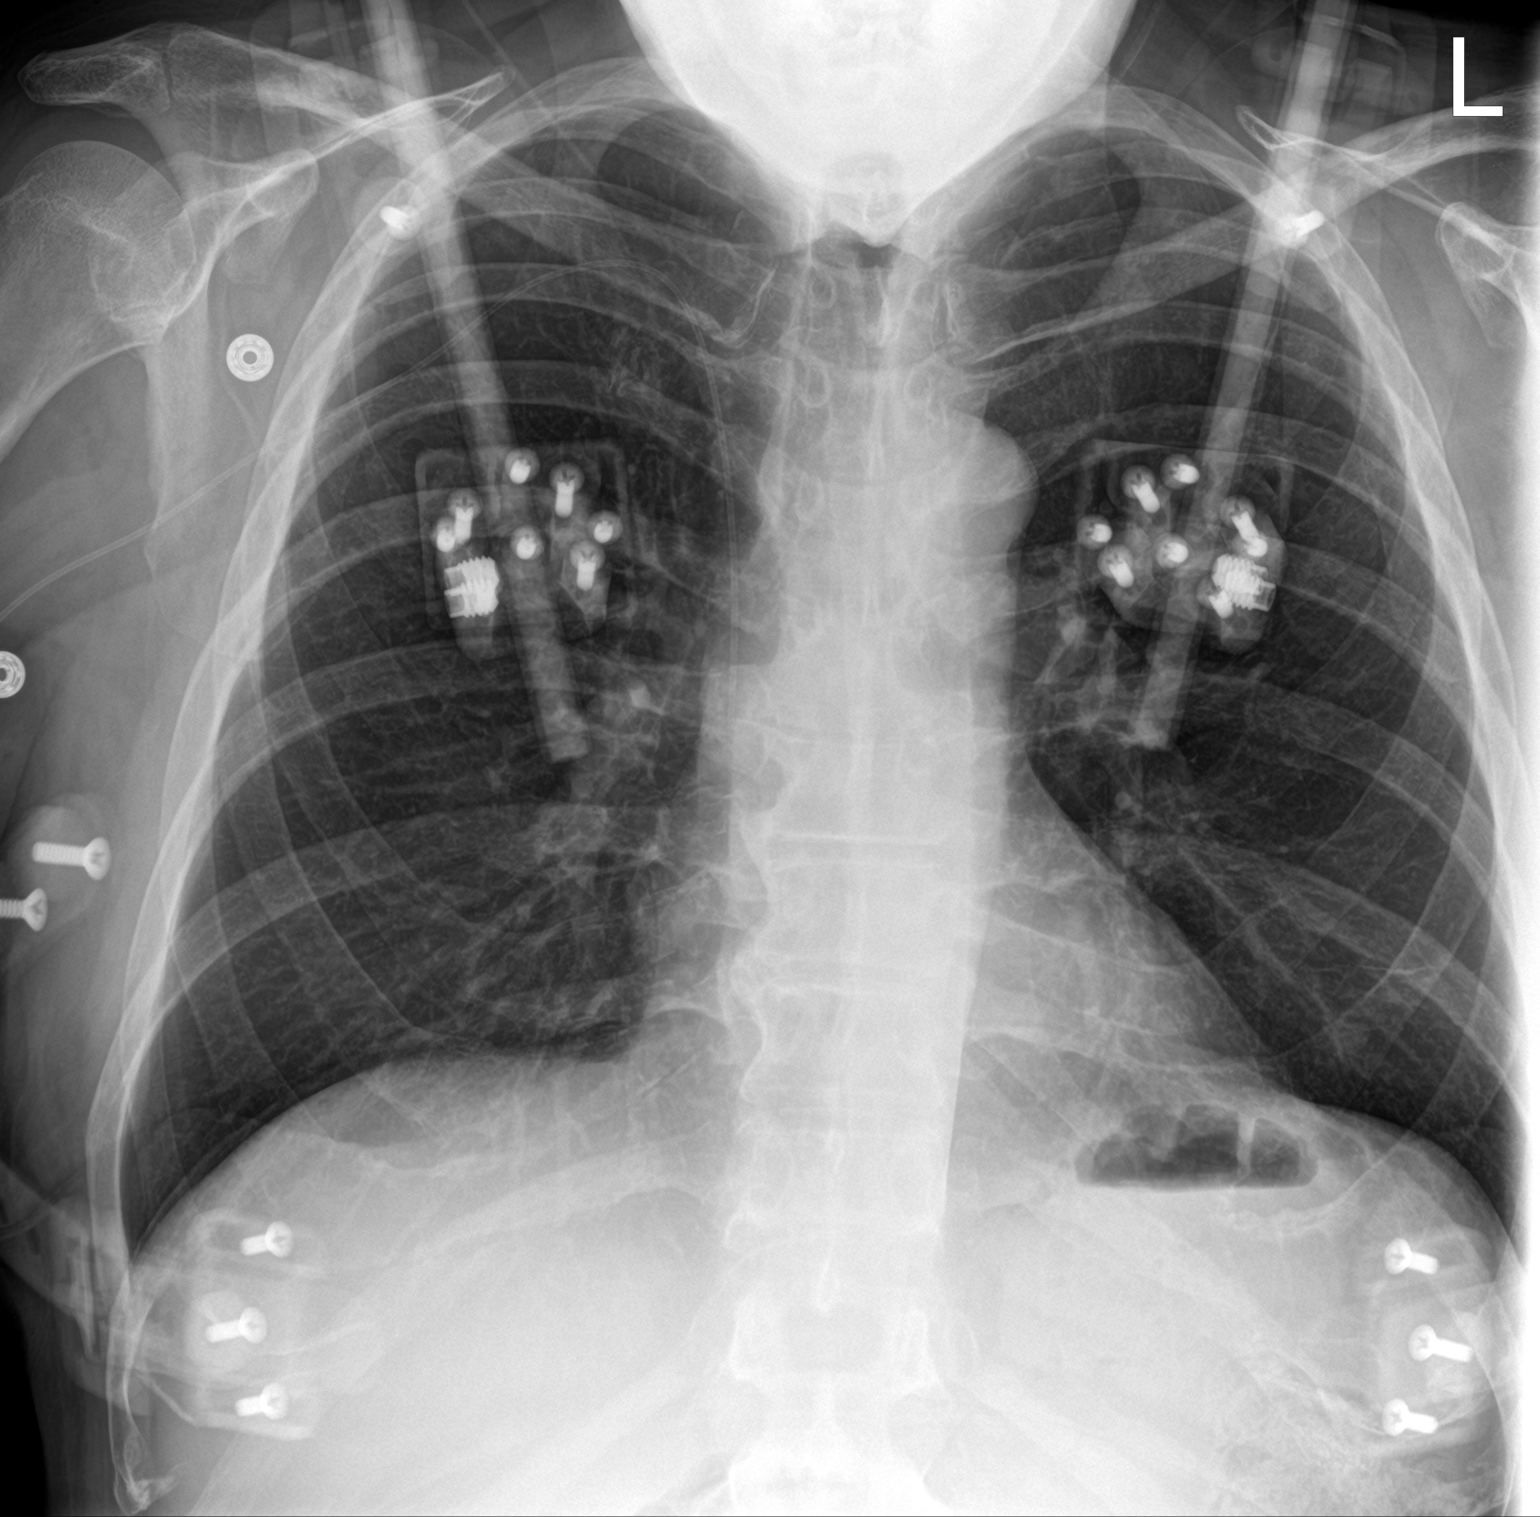

[chest lat (2 of 2)]
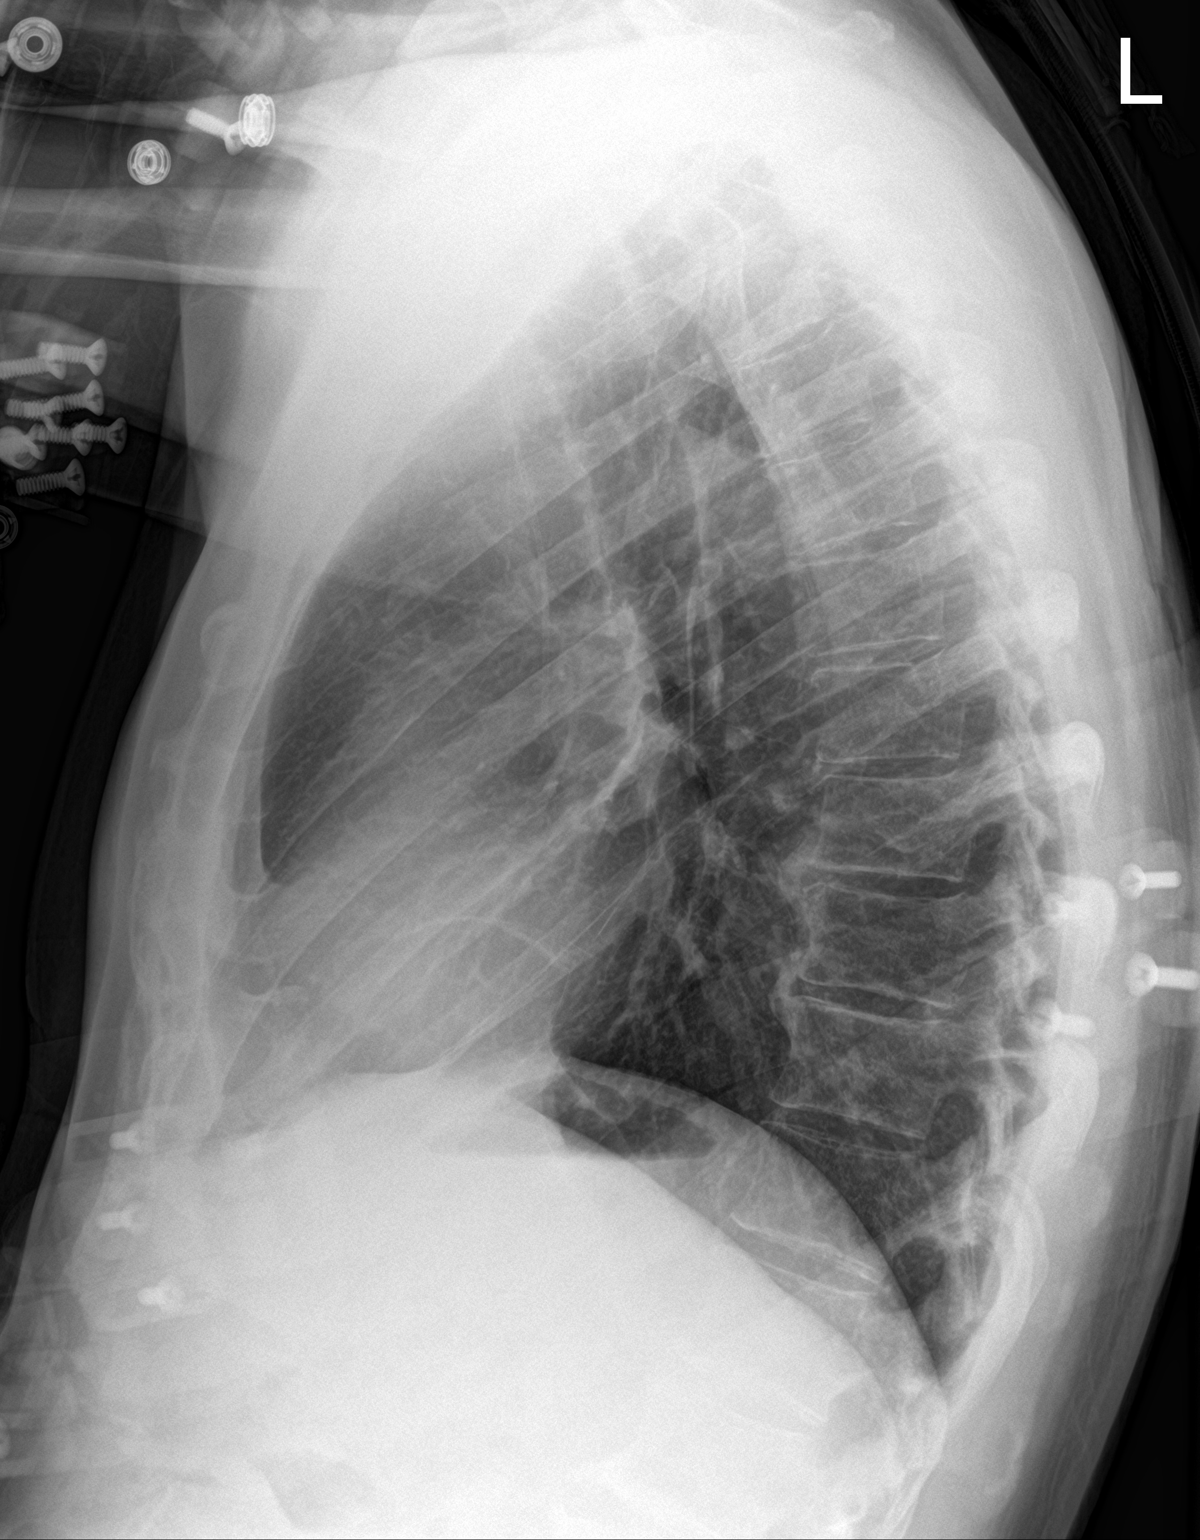

[2 of 2 positions shown; findings below may reference images not displayed]

FINDINGS: Right upper extremity catheter tip overlies the distal SVC. No acute
opacity or pleural effusion. The heart size is within normal limits.
No pneumothorax. Metallic support device over the chest.
IMPRESSION: Right upper extremity catheter tip overlies the distal SVC.

## 2021-03-27 DIAGNOSIS — U071 COVID-19: Secondary | ICD-10-CM

## 2021-03-27 HISTORY — DX: COVID-19: U07.1

## 2021-04-02 ENCOUNTER — Encounter (HOSPITAL_BASED_OUTPATIENT_CLINIC_OR_DEPARTMENT_OTHER): Payer: Self-pay | Admitting: *Deleted

## 2021-04-02 ENCOUNTER — Emergency Department (HOSPITAL_BASED_OUTPATIENT_CLINIC_OR_DEPARTMENT_OTHER)
Admission: EM | Admit: 2021-04-02 | Discharge: 2021-04-03 | Disposition: A | Discharge status: Home / Self Care | Payer: BLUE CROSS/BLUE SHIELD | Attending: Emergency Medicine | Admitting: Emergency Medicine

## 2021-04-02 ENCOUNTER — Other Ambulatory Visit: Payer: Self-pay

## 2021-04-02 DIAGNOSIS — F17290 Nicotine dependence, other tobacco product, uncomplicated: Secondary | ICD-10-CM | POA: Insufficient documentation

## 2021-04-02 DIAGNOSIS — Z794 Long term (current) use of insulin: Secondary | ICD-10-CM | POA: Insufficient documentation

## 2021-04-02 DIAGNOSIS — B3749 Other urogenital candidiasis: Secondary | ICD-10-CM | POA: Insufficient documentation

## 2021-04-02 DIAGNOSIS — M542 Cervicalgia: Secondary | ICD-10-CM | POA: Insufficient documentation

## 2021-04-02 DIAGNOSIS — E1165 Type 2 diabetes mellitus with hyperglycemia: Secondary | ICD-10-CM | POA: Insufficient documentation

## 2021-04-02 DIAGNOSIS — I10 Essential (primary) hypertension: Secondary | ICD-10-CM | POA: Insufficient documentation

## 2021-04-02 DIAGNOSIS — E1140 Type 2 diabetes mellitus with diabetic neuropathy, unspecified: Secondary | ICD-10-CM | POA: Insufficient documentation

## 2021-04-02 DIAGNOSIS — M549 Dorsalgia, unspecified: Secondary | ICD-10-CM | POA: Insufficient documentation

## 2021-04-02 DIAGNOSIS — R739 Hyperglycemia, unspecified: Secondary | ICD-10-CM

## 2021-04-02 DIAGNOSIS — Y908 Blood alcohol level of 240 mg/100 ml or more: Secondary | ICD-10-CM | POA: Insufficient documentation

## 2021-04-02 DIAGNOSIS — F10129 Alcohol abuse with intoxication, unspecified: Secondary | ICD-10-CM | POA: Insufficient documentation

## 2021-04-02 DIAGNOSIS — G8929 Other chronic pain: Secondary | ICD-10-CM | POA: Insufficient documentation

## 2021-04-02 DIAGNOSIS — F10920 Alcohol use, unspecified with intoxication, uncomplicated: Secondary | ICD-10-CM

## 2021-04-02 DIAGNOSIS — Z79899 Other long term (current) drug therapy: Secondary | ICD-10-CM | POA: Insufficient documentation

## 2021-04-02 MED ORDER — PANTOPRAZOLE SODIUM 40 MG IV SOLR
40.0000 mg | Freq: Once | INTRAVENOUS | Status: AC
  Administered 2021-04-03: 40 mg via INTRAVENOUS
  Filled 2021-04-02: qty 40

## 2021-04-02 MED ORDER — SODIUM CHLORIDE 0.9 % IV BOLUS
1000.0000 mL | Freq: Once | INTRAVENOUS | Status: AC
  Administered 2021-04-03: 1000 mL via INTRAVENOUS

## 2021-04-02 MED ORDER — ONDANSETRON HCL 4 MG/2ML IJ SOLN
4.0000 mg | Freq: Once | INTRAMUSCULAR | Status: AC
  Administered 2021-04-03: 4 mg via INTRAVENOUS
  Filled 2021-04-02: qty 2

## 2021-04-02 NOTE — ED Triage Notes (Signed)
Per EMS report.  Bystanders at [address]. Call EMS as found outside on ground.  Inially confused but at present alert.  Hx Diabetics  Heart rate inially 130s   Patient reportly drank "bottle of Burbon"

## 2021-04-03 LAB — URINALYSIS, ROUTINE W REFLEX MICROSCOPIC
Bilirubin Urine: NEGATIVE
Glucose, UA: >=500 mg/dL — AB
Ketones, ur: NEGATIVE mg/dL
Nitrite: NEGATIVE
Protein, ur: NEGATIVE mg/dL
Specific Gravity, Urine: <1.005 — ABNORMAL LOW (ref 1.005–1.030)
pH: 5.5 (ref 5.0–8.0)

## 2021-04-03 LAB — BASIC METABOLIC PANEL
Anion gap: 13 (ref 5–15)
BUN: 10 mg/dL (ref 6–20)
CO2: 23 mmol/L (ref 22–32)
Calcium: 8.4 mg/dL — ABNORMAL LOW (ref 8.9–10.3)
Chloride: 101 mmol/L (ref 98–111)
Creatinine, Ser: 0.97 mg/dL (ref 0.61–1.24)
GFR, Estimated: >60 mL/min (ref >60)
Glucose, Bld: 462 mg/dL — ABNORMAL HIGH (ref 70–99)
Potassium: 3.9 mmol/L (ref 3.5–5.1)
Sodium: 137 mmol/L (ref 135–145)

## 2021-04-03 LAB — CBG MONITORING, ED
Glucose-Capillary: 126 mg/dL — ABNORMAL HIGH (ref 70–99)
Glucose-Capillary: 362 mg/dL — ABNORMAL HIGH (ref 70–99)
Glucose-Capillary: 439 mg/dL — ABNORMAL HIGH (ref 70–99)

## 2021-04-03 LAB — CBC WITH DIFFERENTIAL/PLATELET
Abs Immature Granulocytes: 0.03 10*3/uL (ref 0.00–0.07)
Basophils Absolute: 0 10*3/uL (ref 0.0–0.1)
Basophils Relative: 1 %
Eosinophils Absolute: 0.5 10*3/uL (ref 0.0–0.5)
Eosinophils Relative: 6 %
HCT: 40 % (ref 39.0–52.0)
Hemoglobin: 13.5 g/dL (ref 13.0–17.0)
Immature Granulocytes: 0 %
Lymphocytes Relative: 22 %
Lymphs Abs: 1.8 10*3/uL (ref 0.7–4.0)
MCH: 29 pg (ref 26.0–34.0)
MCHC: 33.8 g/dL (ref 30.0–36.0)
MCV: 85.8 fL (ref 80.0–100.0)
Monocytes Absolute: 0.6 10*3/uL (ref 0.1–1.0)
Monocytes Relative: 8 %
Neutro Abs: 5.2 10*3/uL (ref 1.7–7.7)
Neutrophils Relative %: 63 %
Platelets: 418 10*3/uL — ABNORMAL HIGH (ref 150–400)
RBC: 4.66 MIL/uL (ref 4.22–5.81)
RDW: 13.8 % (ref 11.5–15.5)
WBC: 8.1 10*3/uL (ref 4.0–10.5)
nRBC: 0 % (ref 0.0–0.2)

## 2021-04-03 LAB — I-STAT VENOUS BLOOD GAS, ED
Acid-Base Excess: 0 mmol/L (ref 0.0–2.0)
Bicarbonate: 25.8 mmol/L (ref 20.0–28.0)
Calcium, Ion: 1.06 mmol/L — ABNORMAL LOW (ref 1.15–1.40)
HCT: 44 % (ref 39.0–52.0)
Hemoglobin: 15 g/dL (ref 13.0–17.0)
O2 Saturation: 89 %
Patient temperature: 97.3
Potassium: 3.9 mmol/L (ref 3.5–5.1)
Sodium: 139 mmol/L (ref 135–145)
TCO2: 27 mmol/L (ref 22–32)
pCO2, Ven: 44.5 mmHg (ref 44.0–60.0)
pH, Ven: 7.369 (ref 7.250–7.430)
pO2, Ven: 57 mmHg — ABNORMAL HIGH (ref 32.0–45.0)

## 2021-04-03 LAB — URINALYSIS, MICROSCOPIC (REFLEX)

## 2021-04-03 LAB — ETHANOL: Alcohol, Ethyl (B): 319 mg/dL (ref <10)

## 2021-04-03 MED ORDER — DEXTROSE 50 % IV SOLN: 0.0000 mL | INTRAVENOUS | Status: DC | PRN

## 2021-04-03 MED ORDER — INSULIN REGULAR(HUMAN) IN NACL 100-0.9 UT/100ML-% IV SOLN
INTRAVENOUS | Status: DC
Start: 2021-04-03 — End: 2021-04-03
  Administered 2021-04-03: 19 [IU]/h via INTRAVENOUS
  Filled 2021-04-03: qty 100

## 2021-04-03 MED ORDER — DEXTROSE IN LACTATED RINGERS 5 % IV SOLN: INTRAVENOUS | Status: DC

## 2021-04-03 MED ORDER — FLUCONAZOLE 150 MG PO TABS
150.0000 mg | ORAL_TABLET | Freq: Once | ORAL | Status: AC
  Administered 2021-04-03: 150 mg via ORAL
  Filled 2021-04-03: qty 1

## 2021-04-03 MED ORDER — FOSFOMYCIN TROMETHAMINE 3 G PO PACK
3.0000 g | PACK | Freq: Once | ORAL | Status: AC
  Administered 2021-04-03: 3 g via ORAL
  Filled 2021-04-03: qty 3

## 2021-04-03 NOTE — ED Provider Notes (Signed)
[facility] Provider Note: Lowella Dell, MD, FACEP  CSN: [id number] MRN: [id number] ARRIVAL: 04/02/21 at 2340 ROOM: MH05/[facility]05   CHIEF COMPLAINT  Alcohol Intoxication   HISTORY OF PRESENT ILLNESS  04/03/21 12:09 AM Jonathan Horne is a 60 y.o. male with a history of diabetes and bipolar disorder.  He was found [place] the ground at a motel by bystanders.  They report he was initially confused but is currently alert.  He admittedly drank "bottle of bourbon".  He was noted to have a heart rate initially in the 130s and this improved with 500 mL IV fluid bolus.  He admits to being noncompliant with his insulin and other health needs.  He states he cannot afford his medications. He denies shortness of breath, nausea or vomiting.  He does have chronic back and neck pain.   Past Medical History:  Diagnosis Date  . ADHD   . Bipolar 1 disorder (HCC)   . Chronic anemia   . Diabetes mellitus (HCC)   . Encephalopathy due to infection 09/2018  . History of alcohol abuse   . HTN (hypertension)   . Insomnia   . Meningitis due to bacteria   . Rash    chronic  . Ulnar neuropathy     Past Surgical History:  Procedure Laterality Date  . CERVICAL LAMINECTOMY  10/11/2018   C1- C3 posterior laminectomy with decompression of spinal cord  . CYST EXCISION     from left hip.     Family History  Problem Relation Age of Onset  . Coronary artery disease Father   . Diabetes Father   . Alcohol abuse Brother   . Asperger's syndrome Son   . Asperger's syndrome Daughter     Social History   Tobacco Use  . Smoking status: Current Some Day Smoker    Types: Cigars  . Smokeless tobacco: Never Used  Vaping Use  . Vaping Use: Never used  Substance Use Topics  . Alcohol use: Yes    Comment: denies regular etoh use  . Drug use: Yes    Comment: states uses delta 8 for pain    Prior to Admission medications   Medication Sig Start Date End Date Taking? Authorizing Provider  ARIPiprazole  (ABILIFY) 15 MG tablet Take 15 mg by mouth daily. 08/22/18   [provider]  atorvastatin (LIPITOR) 80 MG tablet Take 80 mg by mouth daily. 09/27/18   [provider]  bisacodyl (DULCOLAX) 10 MG suppository Place 1 suppository (10 mg total) rectally daily at 6 (six) AM. 11/07/18   Love, Evlyn Kanner, PA-C  insulin aspart (NOVOLOG) cartridge Inject 8-10 Units into the skin 3 (three) times daily with meals. Take 8 units with breakfast. Take 10 units with lunch and supper. 11/06/18   Love, Evlyn Kanner, PA-C  Insulin Glargine (LANTUS) 100 UNIT/ML Solostar Pen Inject 28 Units into the skin daily. 11/06/18   Love, Evlyn Kanner, PA-C  Insulin Pen Needle (CAREFINE PEN NEEDLES) 31G X 6 MM MISC 1 application by Does not apply route 3 (three) times daily with meals. 11/06/18   Love, Evlyn Kanner, PA-C  lidocaine (XYLOCAINE) 2 % jelly Apply topically as needed (Use with in and out catheter). 11/06/18   Love, Evlyn Kanner, PA-C  methocarbamol (ROBAXIN) 750 MG tablet Take 1 tablet (750 mg total) by mouth every 6 (six) hours as needed for muscle spasms (neck/shoulder pain). 11/06/18   Love, Evlyn Kanner, PA-C  nystatin cream (MYCOSTATIN) Apply topically 2 (two) times  daily. 11/06/18   Love, Evlyn KannerPamela S, PA-C  protein supplement (RESOURCE BENEPROTEIN) POWD Take 6 g by mouth 3 (three) times daily with meals. 11/06/18   Love, Evlyn KannerPamela S, PA-C  senna-docusate (SENOKOT-S) 8.6-50 MG tablet Take 2 tablets by mouth at bedtime. 11/06/18   Love, Evlyn KannerPamela S, PA-C  sertraline (ZOLOFT) 100 MG tablet Take 1 tablet (100 mg total) by mouth daily. 11/06/18   Love, Evlyn KannerPamela S, PA-C  sodium phosphate (FLEET) 7-19 GM/118ML ENEM Place 133 mLs (1 enema total) rectally daily as needed for severe constipation. 11/06/18   Love, Evlyn KannerPamela S, PA-C  tamsulosin (FLOMAX) 0.4 MG CAPS capsule Take 1 capsule (0.4 mg total) by mouth daily. 11/06/18   Love, Evlyn KannerPamela S, PA-C    Allergies Lisinopril, Reglan [metoclopramide], and Tramadol   REVIEW OF SYSTEMS   Negative except as noted here or in the History of Present Illness.   PHYSICAL EXAMINATION  Initial Vital Signs Blood pressure (!) 160/82, pulse (!) 121, temperature (!) 97.3 F (36.3 C), temperature source Oral, resp. rate 19, height 5\' 10"  (1.778 m), weight 90.1 kg, SpO2 97 %.  Examination General: Well-developed, well-nourished male in no acute distress; appearance consistent with age of record HENT: normocephalic; atraumatic Eyes: pupils equal, round and reactive to light; extraocular muscles intact Neck: supple Heart: regular rate and rhythm Lungs: clear to auscultation bilaterally Abdomen: soft; nondistended; nontender; bowel sounds present Extremities: No deformity; full range of motion; pulses normal Neurologic: Awake, alert and oriented; motor function intact in all extremities and symmetric; no facial droop Skin: Warm and dry Psychiatric: Normal mood and affect   RESULTS  Summary of this visit's results, reviewed and interpreted by myself:   EKG Interpretation  Date/Time:  Saturday Apr 02 2021 23:48:22 EDT Ventricular Rate:  117 PR Interval:  167 QRS Duration: 86 QT Interval:  322 QTC Calculation: 450 R Axis:   65 Text Interpretation: Sinus tachycardia Probable left atrial enlargement Low voltage, precordial leads No previous ECGs available Confirmed by Amit Meloy, Jonny RuizJohn ([id number]) on 04/02/2021 11:59:13 PM      Laboratory Studies: Results for orders placed or performed during the hospital encounter of 04/02/21 (from the past 24 hour(s))  POC CBG, ED     Status: Abnormal   Collection Time: 04/03/21 12:00 AM  Result Value Ref Range   Glucose-Capillary 439 (H) 70 - 99 mg/dL  Ethanol     Status: Abnormal   Collection Time: 04/03/21 12:12 AM  Result Value Ref Range   Alcohol, Ethyl (B) 319 (HH) <10 mg/dL  Basic metabolic panel     Status: Abnormal   Collection Time: 04/03/21 12:12 AM  Result Value Ref Range   Sodium 137 135 - 145 mmol/L   Potassium 3.9 3.5 - 5.1  mmol/L   Chloride 101 98 - 111 mmol/L   CO2 23 22 - 32 mmol/L   Glucose, Bld 462 (H) 70 - 99 mg/dL   BUN 10 6 - 20 mg/dL   Creatinine, Ser 6.040.97 0.61 - 1.24 mg/dL   Calcium 8.4 (L) 8.9 - 10.3 mg/dL   GFR, Estimated >54>60 >09>60 mL/min   Anion gap 13 5 - 15  CBC with Differential (PNL)     Status: Abnormal   Collection Time: 04/03/21 12:12 AM  Result Value Ref Range   WBC 8.1 4.0 - 10.5 K/uL   RBC 4.66 4.22 - 5.81 MIL/uL   Hemoglobin 13.5 13.0 - 17.0 g/dL   HCT 81.140.0 91.439.0 - 78.252.0 %   MCV 85.8 80.0 - 100.0  fL   MCH 29.0 26.0 - 34.0 pg   MCHC 33.8 30.0 - 36.0 g/dL   RDW 62.6 94.8 - 54.6 %   Platelets 418 (H) 150 - 400 K/uL   nRBC 0.0 0.0 - 0.2 %   Neutrophils Relative % 63 %   Neutro Abs 5.2 1.7 - 7.7 K/uL   Lymphocytes Relative 22 %   Lymphs Abs 1.8 0.7 - 4.0 K/uL   Monocytes Relative 8 %   Monocytes Absolute 0.6 0.1 - 1.0 K/uL   Eosinophils Relative 6 %   Eosinophils Absolute 0.5 0.0 - 0.5 K/uL   Basophils Relative 1 %   Basophils Absolute 0.0 0.0 - 0.1 K/uL   Immature Granulocytes 0 %   Abs Immature Granulocytes 0.03 0.00 - 0.07 K/uL  Urinalysis, Routine w reflex microscopic Urine, Clean Catch     Status: Abnormal   Collection Time: 04/03/21 12:12 AM  Result Value Ref Range   Color, Urine YELLOW YELLOW   APPearance HAZY (A) CLEAR   Specific Gravity, Urine <1.005 (L) 1.005 - 1.030   pH 5.5 5.0 - 8.0   Glucose, UA >=500 (A) NEGATIVE mg/dL   Hgb urine dipstick LARGE (A) NEGATIVE   Bilirubin Urine NEGATIVE NEGATIVE   Ketones, ur NEGATIVE NEGATIVE mg/dL   Protein, ur NEGATIVE NEGATIVE mg/dL   Nitrite NEGATIVE NEGATIVE   Leukocytes,Ua SMALL (A) NEGATIVE  Urinalysis, Microscopic (reflex)     Status: Abnormal   Collection Time: 04/03/21 12:12 AM  Result Value Ref Range   RBC / HPF 0-5 0 - 5 RBC/hpf   WBC, UA 21-50 0 - 5 WBC/hpf   Bacteria, UA RARE (A) NONE SEEN   Squamous Epithelial / LPF 0-5 0 - 5   Budding Yeast PRESENT   I-Stat venous blood gas, ED     Status: Abnormal    Collection Time: 04/03/21 12:30 AM  Result Value Ref Range   pH, Ven 7.369 7.250 - 7.430   pCO2, Ven 44.5 44.0 - 60.0 mmHg   pO2, Ven 57.0 (H) 32.0 - 45.0 mmHg   Bicarbonate 25.8 20.0 - 28.0 mmol/L   TCO2 27 22 - 32 mmol/L   O2 Saturation 89.0 %   Acid-Base Excess 0.0 0.0 - 2.0 mmol/L   Sodium 139 135 - 145 mmol/L   Potassium 3.9 3.5 - 5.1 mmol/L   Calcium, Ion 1.06 (L) 1.15 - 1.40 mmol/L   HCT 44.0 39.0 - 52.0 %   Hemoglobin 15.0 13.0 - 17.0 g/dL   Patient temperature 27.0 F    Collection site IV start    Drawn by Nurse    Sample type VENOUS   CBG monitoring, ED     Status: Abnormal   Collection Time: 04/03/21  1:34 AM  Result Value Ref Range   Glucose-Capillary 362 (H) 70 - 99 mg/dL  CBG monitoring, ED     Status: Abnormal   Collection Time: 04/03/21  2:55 AM  Result Value Ref Range   Glucose-Capillary 126 (H) 70 - 99 mg/dL   Imaging Studies: No results found.  ED COURSE and MDM  Nursing notes, initial and subsequent vitals signs, including pulse oximetry, reviewed and interpreted by myself.  Vitals:   04/03/21 0130 04/03/21 0145 04/03/21 0215 04/03/21 0315  BP: (!) 153/94 117/74 112/78 115/71  Pulse: (!) 120 (!) 114 (!) 107 (!) 114  Resp: 16 20 16 19   Temp:      TempSrc:      SpO2: 95% 100% 92% 95%  Weight:  Height:       Medications  dextrose 5 % in lactated ringers infusion (0 mLs Intravenous Hold 04/03/21 0203)  dextrose 50 % solution 0-50 mL (has no administration in time range)  fosfomycin (MONUROL) packet 3 g (has no administration in time range)  fluconazole (DIFLUCAN) tablet 150 mg (has no administration in time range)  sodium chloride 0.9 % bolus 1,000 mL (0 mLs Intravenous Stopped 04/03/21 0123)  ondansetron (ZOFRAN) injection 4 mg (4 mg Intravenous Given 04/03/21 0021)  pantoprazole (PROTONIX) injection 40 mg (40 mg Intravenous Given 04/03/21 0019)   3:00 AM Glucose down to 126.  We will discontinue insulin drip.  3:18 AM Fosfomycin and Diflucan  given for apparent urinary tract infection on urinalysis.  Urine culture and GC/chlamydia pending.   PROCEDURES  Procedures   ED DIAGNOSES     ICD-10-CM   1. Alcoholic intoxication without complication (HCC)  F10.920   2. Hyperglycemia  R73.9   3. Yeast UTI  B37.49        Rector Devonshire, Jonny Ruiz, MD 04/03/21 [phone]

## 2021-04-03 NOTE — ED Notes (Signed)
Pt denies nausea and states he feels hungry/thirsty. Pt given microwavable meal & mac n cheese w/ diet [place] to eat and drink. Pt consumed 100% of meal w/ no complaints.

## 2021-04-03 NOTE — ED Notes (Addendum)
Patient states had three drinks of bourbon tonight.  Does not recall passing out,   States does not drink on regularly,  At present alert but states feels "shaky".  Denies any chest pain or SOB

## 2021-04-03 NOTE — ED Notes (Signed)
Dr. Read Drivers made aware of CBG 439 and pt's VS

## 2021-04-03 NOTE — ED Notes (Signed)
ED Provider at bedside. 

## 2021-04-04 ENCOUNTER — Encounter (HOSPITAL_COMMUNITY): Payer: Self-pay | Admitting: Registered Nurse

## 2021-04-04 ENCOUNTER — Other Ambulatory Visit: Payer: Self-pay

## 2021-04-04 ENCOUNTER — Ambulatory Visit (HOSPITAL_COMMUNITY)
Admission: EM | Admit: 2021-04-04 | Discharge: 2021-04-04 | Disposition: A | Discharge status: Home / Self Care | Payer: No Payment, Other | Attending: Registered Nurse | Admitting: Registered Nurse

## 2021-04-04 DIAGNOSIS — F12951 Cannabis use, unspecified with psychotic disorder with hallucinations: Secondary | ICD-10-CM | POA: Insufficient documentation

## 2021-04-04 DIAGNOSIS — R441 Visual hallucinations: Secondary | ICD-10-CM | POA: Diagnosis present

## 2021-04-04 DIAGNOSIS — F19951 Other psychoactive substance use, unspecified with psychoactive substance-induced psychotic disorder with hallucinations: Secondary | ICD-10-CM | POA: Diagnosis present

## 2021-04-04 LAB — URINE CULTURE: Culture: <10000 — AB

## 2021-04-04 LAB — GC/CHLAMYDIA PROBE AMP (~~LOC~~) NOT AT ARMC
Chlamydia: NEGATIVE
Comment: NEGATIVE
Comment: NORMAL
Neisseria Gonorrhea: NEGATIVE

## 2021-04-04 NOTE — Progress Notes (Signed)
Patient received After Visit Summary with follow up instructions and community resources. Patient Denies SI/HI and AVH. Patient discharged home.

## 2021-04-04 NOTE — BH Assessment (Signed)
Patient reports to [facility] via car. Patient reports seeing people move out of the corner of his eyes Patient reports having these VH about 5 weeks. Patient  denies SI/ HI / AVH today. Patient has been off his medication  due to finances for 6 months . Patient recently used CBD gummies 20 mg . Patient is routine. I provided information for Open Access to re establish out patient resources to patient during triage.

## 2021-04-04 NOTE — ED Provider Notes (Signed)
Behavioral Health Urgent Care Medical Screening Exam  Patient Name: Jonathan Horne MRN: [id number] Date of Evaluation: 04/04/21 Chief Complaint:   Diagnosis:  Final diagnoses:  Visual hallucinations  Substance-induced psychotic disorder with hallucinations (HCC)    History of Present illness: Jonathan Horne is a 60 y.o. male patient presented to [facility] as a walk in with complaints of being off of his psychotropic medications for 6 months and wanting resources for outpatient psychiatric services  Jonathan Horne, 60 y.o., male patient seen face to face by this provider, consulted with Dr. Nelly Rout; and chart reviewed on 04/04/21.  On evaluation Jonathan Horne reports he would like to be restated on his medications but doesn't have an outpatient psychiatric provider.  Patient also states he has had difficulty sleeping for the last couple of days.  Patient reporting he is currently homeless.  Discussed Open Access for medication management and therapy intake.  Patient denies suicidal/self-harm/homicidal ideation.  Patient also denies auditory/visual hallucinations.  States he took 2 CBD gummies 2 days ago and after taking had visual hallucinations that are no longer occurring.   During evaluation Jonathan Horne is sitting up right in chair in no acute distress.  He is alert, oriented x 4, calm and cooperative.  His mood is dysphoric with congruent affect.  He does not appear to be responding to internal/external stimuli or delusional thoughts.  Patient denies suicidal/self-harm/homicidal ideation, psychosis, and paranoia.  Patient answered question appropriately.  Patient willing to come to [facility] for Open Access tomorrow morning for medication and therapy intake.   The suicide prevention education provided includes the following:  Suicide risk factors  Suicide prevention and interventions  National Suicide Hotline telephone number  [facility] assessment telephone number  [facility] Emergency Assistance 911  [facility] and/or Residential Mobile Crisis Unit telephone number   Request made of family/significant other to:  Remove weapons (e.g., guns, rifles, knives), all items previously/currently identified as safety concern.   Remove drugs/medications (over the counter, prescriptions, illicit drugs), all items previously/currently identified as a safety concern.   Psychiatric Specialty Exam  Presentation  General Appearance:Appropriate for Environment; Casual  Eye Contact:Good  Speech:Clear and Coherent; Normal Rate  Speech Volume:Normal  Handedness:Right   Mood and Affect  Mood:Anxious; Dysphoric  Affect:Appropriate; Congruent   Thought Process  Thought Processes:Coherent; Goal Directed  Descriptions of Associations:Intact  Orientation:Full (Time, Place and Person)  Thought Content:WDL    Hallucinations:None (Denies at this time but states after doing CBD 2 days ago he had visual hallucinations but none now)  Ideas of Reference:None  Suicidal Thoughts:No  Homicidal Thoughts:No   Sensorium  Memory:Immediate Good; Recent Good; Remote Good  Judgment:Intact  Insight:Present   Executive Functions  Concentration:Good  Attention Span:Good  Recall:Good  Fund of Knowledge:Good  Language:Good   Psychomotor Activity  Psychomotor Activity:Normal   Assets  Assets:Communication Skills; Desire for Improvement; Resilience   Sleep  Sleep:Fair  Number of hours: No data recorded  Nutritional Assessment (For OBS and FBC admissions only) Has the patient had a weight loss or gain of 10 pounds or more in the last 3 months?: No Has the patient had a decrease in food intake/or appetite?: No Does the patient have dental problems?: No Does the patient have eating habits or behaviors that may be indicators of an eating disorder including binging or inducing vomiting?: No Has the patient recently lost weight without trying?: No Has the  patient been eating poorly because  of a decreased appetite?: No Malnutrition Screening Tool Score: 0    Physical Exam: Physical Exam Vitals and nursing note reviewed. Exam conducted with a chaperone present.  Constitutional:      General: He is not in acute distress.    Appearance: Normal appearance. He is not ill-appearing.  HENT:     Head: Normocephalic.  Cardiovascular:     Rate and Rhythm: Normal rate.  Pulmonary:     Effort: Pulmonary effort is normal.  Musculoskeletal:     Cervical back: Normal range of motion.     Comments: Using a walking stick to assist with ambulation   Skin:    General: Skin is warm and dry.  Neurological:     General: No focal deficit present.     Mental Status: He is alert and oriented to person, place, and time.  Psychiatric:        Attention and Perception: Attention and perception normal. He does not perceive auditory or visual hallucinations.        Mood and Affect: Mood and affect normal.        Speech: Speech normal.        Behavior: Behavior normal. Behavior is cooperative.        Thought Content: Thought content normal. Thought content is not paranoid or delusional. Thought content does not include homicidal or suicidal ideation.        Cognition and Memory: Cognition and memory normal.        Judgment: Judgment normal.    Review of Systems  Constitutional: Negative.   HENT: Negative.   Eyes: Negative.   Respiratory: Negative.   Cardiovascular: Negative.   Gastrointestinal: Negative.   Genitourinary: Negative.   Musculoskeletal: Positive for joint pain and myalgias.  Skin: Negative.   Neurological: Negative.   Endo/Heme/Allergies: Negative.   Psychiatric/Behavioral: Negative for memory loss. Depression: Stable. Hallucinations: Denies. Substance abuse: Denies. Suicidal ideas: Denies. Nervous/anxious: Stable. Insomnia: Trouble sleeping for last couple of days.    Blood pressure (!) 164/99, pulse (!) 115, resp. rate 18, SpO2 98 %.  There is no height or weight on file to calculate BMI.  Musculoskeletal: Strength & Muscle Tone: within normal limits Gait & Station: normal Patient leans: N/A  [facility] MSE Discharge Disposition for Follow up and Recommendations: Based on my evaluation the patient does not appear to have an emergency medical condition and can be discharged with resources and follow up care in outpatient services for Medication Management, Individual Therapy and Group Therapy  Resource Handouts for shelters, outpatient psychiatric services, and suicidal prevention given along with    Follow-up Information    Go to  [facility].   Specialty: Urgent Care Why: Open Access:  Monday - Thursday from 8 am to 11 am for medication management and therapy intake.  On Friday from 1 pm to 4 pm for therapy intake only Contact information: [address] [place] [address] [phone]               Assunta Found, NP 04/04/2021, 5:23 PM

## 2021-04-05 ENCOUNTER — Encounter (HOSPITAL_COMMUNITY): Payer: Self-pay | Admitting: Psychiatry

## 2021-04-05 ENCOUNTER — Other Ambulatory Visit: Payer: Self-pay

## 2021-04-05 ENCOUNTER — Ambulatory Visit (HOSPITAL_COMMUNITY): Payer: No Payment, Other | Admitting: Psychiatry

## 2021-04-05 ENCOUNTER — Ambulatory Visit (INDEPENDENT_AMBULATORY_CARE_PROVIDER_SITE_OTHER): Payer: No Payment, Other | Admitting: Psychiatry

## 2021-04-05 VITALS — BP 164/104 | HR 119 | Ht 70.5 in | Wt 190.0 lb

## 2021-04-05 DIAGNOSIS — F411 Generalized anxiety disorder: Secondary | ICD-10-CM

## 2021-04-05 DIAGNOSIS — F3162 Bipolar disorder, current episode mixed, moderate: Secondary | ICD-10-CM | POA: Diagnosis not present

## 2021-04-05 MED ORDER — CAPLYTA 42 MG PO CAPS
42.0000 mg | ORAL_CAPSULE | Freq: Every day | ORAL | 2 refills | Status: AC
Start: 2021-04-05 — End: ?
  Filled 2021-04-05: qty 30, 30d supply, fill #0

## 2021-04-05 MED ORDER — TRAZODONE HCL 50 MG PO TABS
50.0000 mg | ORAL_TABLET | Freq: Every evening | ORAL | 2 refills | Status: AC | PRN
  Filled 2021-04-05: qty 30, 30d supply, fill #0

## 2021-04-05 MED ORDER — SERTRALINE HCL 50 MG PO TABS
50.0000 mg | ORAL_TABLET | Freq: Every day | ORAL | 2 refills | Status: AC
  Filled 2021-04-05: qty 30, 30d supply, fill #0

## 2021-04-05 NOTE — Progress Notes (Signed)
Psychiatric Initial Adult Assessment   Patient Identification: Jonathan Horne MRN:  [id number] Date of Evaluation:  04/05/2021 Referral Source: Walk in/ [facility] Chief Complaint:  " I need to restart medications I have been manic for 5 days" Chief Complaint    Walk-In  Med Mgmt     Visit Diagnosis:    ICD-10-CM   1. Bipolar 1 disorder, mixed, moderate (HCC)  F31.62 Lumateperone Tosylate (CAPLYTA) 42 MG CAPS    traZODone (DESYREL) 50 MG tablet    sertraline (ZOLOFT) 50 MG tablet  2. Generalized anxiety disorder  F41.1 traZODone (DESYREL) 50 MG tablet    sertraline (ZOLOFT) 50 MG tablet    History of Present Illness: 60 year old male seen today for initial psychiatric evaluation.  He walked into the [facility] on 04/05/2021 requesting to restart medications.  He was referred to outpatient psychiatry at that time.  He has a psychiatric history of ADHD, bipolar disorder, anxiety, and depression.  Currently he is not managed on medications however notes that he has tried Zoloft, Abilify (notes that he dislikes), Seroquel (disliked notes made him feel too sedated) trazodone, hydroxyzine, Xanax, and Adderall.  Today he is well-groomed, pleasant, cooperative, and engaged in conversation.  He informed provider that for the last 5 days he has been manic.  He notes that he has only been sleeping 1 hour nightly, has fluctuations in his mood, is distractible, has racing thoughts, irritability, and impulsively drinking alcohol after being sober for 10 years.  Patient also endorses visual auditory hallucinations.  He notes that he sees scenarios.  He informed provider that at times he sees someone rubbing his house noting that he has had delusions of a home invasion.  Patient also notes that at times he feels paranoid.  Provider asked patient if he uses illegal substances and he notes that he does not.  He informed Clinical research associate that he uses CBD products occasionally.  Patient notes that his anxiety and depression has  worsened over the last few weeks.  He notes that he is concerned about his housing as he is currently on the verge of being homeless.  He notes that he has 4 children but he is unable to stay with them.  He also informed Clinical research associate that his wife left him after 23 years of marriage and he is currently single.  He notes that they sell their home and he stayed in hotel for 3 months however now he is out of money.  He informed Clinical research associate that he is working with the Futures trader to help manage his social determinants.  Today provider conducted a GAD-7 and patient scored a 21.  Provider also conducted a PHQ-9 and patient scored a 26.  He denies HI however endorses passive SI without a plan or intent.  Today he contracts for safety today.  Today he is agreeable to starting Caplyta 42 mg daily to help manage symptoms of mania.  He is also agreeable to restarting Zoloft 50 mg to help manage anxiety and depression.  He will start trazodone 25 to 50 mg as needed for sleep. Potential side effects of medication and risks vs benefits of treatment vs non-treatment were explained and discussed. All questions were answered.  No other concerns noted at this time.   Associated Signs/Symptoms: Depression Symptoms:  depressed mood, anhedonia, insomnia, psychomotor agitation, fatigue, feelings of worthlessness/guilt, difficulty concentrating, hopelessness, impaired memory, suicidal thoughts with specific plan, anxiety, panic attacks, loss of energy/fatigue, weight gain, decreased appetite, (Hypo) Manic Symptoms:  Distractibility, Elevated Mood,  Flight of Ideas, Hallucinations, Impulsivity, Irritable Mood, Anxiety Symptoms:  Excessive Worry, Panic Symptoms, Psychotic Symptoms:  Hallucinations: Auditory Visual Paranoia, PTSD Symptoms: Had a traumatic exposure:  Notes that his wife separated from him after 23 years marriage  Past Psychiatric History: Bipolar 1, ADHD, depression, and anxiety  Previous  Psychotropic Medications: Zoloft, Abilify (notes that he dislikes), Seroquel (disliked notes made him feel too sedated) trazodone, hydroxyzine, Xanax, and Adderall.  Substance Abuse History in the last 12 months:  No.  Consequences of Substance Abuse: NA  Past Medical History:  Past Medical History:  Diagnosis Date  . ADHD   . Bipolar 1 disorder (HCC)   . Chronic anemia   . Diabetes mellitus (HCC)   . Encephalopathy due to infection 09/2018  . History of alcohol abuse   . HTN (hypertension)   . Insomnia   . Meningitis due to bacteria   . Rash    chronic  . Ulnar neuropathy     Past Surgical History:  Procedure Laterality Date  . CERVICAL LAMINECTOMY  10/11/2018   C1- C3 posterior laminectomy with decompression of spinal cord  . CYST EXCISION     from left hip.     Family Psychiatric History: Brother bipolar disorder, mother depression, father dementia, daughter depression, son ashburgers and ADHD,   Family History:  Family History  Problem Relation Age of Onset  . Coronary artery disease Father   . Diabetes Father   . Alcohol abuse Brother   . Asperger's syndrome Son   . Asperger's syndrome Daughter     Social History:   Social History   Socioeconomic History  . Marital status: Married    Spouse name: Not on file  . Number of children: Not on file  . Years of education: Not on file  . Highest education level: Not on file  Occupational History  . Not on file  Tobacco Use  . Smoking status: Current Some Day Smoker    Types: Cigars  . Smokeless tobacco: Never Used  Vaping Use  . Vaping Use: Never used  Substance and Sexual Activity  . Alcohol use: Yes    Comment: denies regular etoh use  . Drug use: Yes    Comment: states uses delta 8 for pain  . Sexual activity: Not on file  Other Topics Concern  . Not on file  Social History Narrative  . Not on file   Social Determinants of Health   Financial Resource Strain: Not on file  Food Insecurity: Not  on file  Transportation Needs: Not on file  Physical Activity: Not on file  Stress: Not on file  Social Connections: Not on file    Additional Social History: Patient resides in [place]. She is separated and has four children. He is currently unemployed. He uses CBD gummys occasionally. He denies alcohol or illegal drug use. He notes that he smokes cigars occasionally.   Allergies:   Allergies  Allergen Reactions  . Lisinopril     cough  . Reglan [Metoclopramide]     Tremors   . Tramadol Other (See Comments)    hallucinations    Metabolic Disorder Labs: Lab Results  Component Value Date   HGBA1C 8.8 (H) 10/24/2018   MPG 205.86 10/24/2018   No results found for: PROLACTIN No results found for: CHOL, TRIG, HDL, CHOLHDL, VLDL, LDLCALC No results found for: TSH  Therapeutic Level Labs: No results found for: LITHIUM No results found for: CBMZ No results found for: VALPROATE  Current  Medications: Current Outpatient Medications  Medication Sig Dispense Refill  . atorvastatin (LIPITOR) 80 MG tablet Take 80 mg by mouth daily.  1  . bisacodyl (DULCOLAX) 10 MG suppository Place 1 suppository (10 mg total) rectally daily at 6 (six) AM. 30 suppository 0  . insulin aspart (NOVOLOG) cartridge Inject 8-10 Units into the skin 3 (three) times daily with meals. Take 8 units with breakfast. Take 10 units with lunch and supper. 15 mL 11  . Insulin Glargine (LANTUS) 100 UNIT/ML Solostar Pen Inject 28 Units into the skin daily. 15 mL 0  . Insulin Pen Needle (CAREFINE PEN NEEDLES) 31G X 6 MM MISC 1 application by Does not apply route 3 (three) times daily with meals. 100 each 0  . lidocaine (XYLOCAINE) 2 % jelly Apply topically as needed (Use with in and out catheter). 30 mL 2  . Lumateperone Tosylate (CAPLYTA) 42 MG CAPS Take 42 mg by mouth daily. 30 capsule 2  . methocarbamol (ROBAXIN) 750 MG tablet Take 1 tablet (750 mg total) by mouth every 6 (six) hours as needed for muscle spasms  (neck/shoulder pain). 120 tablet 0  . nystatin cream (MYCOSTATIN) Apply topically 2 (two) times daily. 30 g 0  . protein supplement (RESOURCE BENEPROTEIN) POWD Take 6 g by mouth 3 (three) times daily with meals. 3 Can 0  . senna-docusate (SENOKOT-S) 8.6-50 MG tablet Take 2 tablets by mouth at bedtime. 60 tablet 0  . sertraline (ZOLOFT) 50 MG tablet Take 1 tablet (50 mg total) by mouth daily. 30 tablet 2  . sodium phosphate (FLEET) 7-19 GM/118ML ENEM Place 133 mLs (1 enema total) rectally daily as needed for severe constipation.  0  . tamsulosin (FLOMAX) 0.4 MG CAPS capsule Take 1 capsule (0.4 mg total) by mouth daily. 30 capsule 0  . traZODone (DESYREL) 50 MG tablet Take 1 tablet (50 mg total) by mouth at bedtime as needed for sleep. 30 tablet 2   No current facility-administered medications for this visit.    Musculoskeletal: Strength & Muscle Tone: within normal limits Gait & Station: normal Patient leans: N/A  Psychiatric Specialty Exam: Review of Systems  Blood pressure (!) 164/104, pulse (!) 119, height 5' 10.5" (1.791 m), weight 190 lb (86.2 kg).Body mass index is 26.88 kg/m.  General Appearance: Well Groomed  Eye Contact:  Good  Speech:  Clear and Coherent and Normal Rate  Volume:  Normal  Mood:  Anxious, Depressed and Irritable  Affect:  Appropriate and Congruent  Thought Process:  Coherent, Goal Directed and Linear  Orientation:  Full (Time, Place, and Person)  Thought Content:  Logical and Hallucinations: Auditory Visual  Suicidal Thoughts:  Yes.  without intent/plan  Homicidal Thoughts:  No  Memory:  Immediate;   Good Recent;   Good Remote;   Good  Judgement:  Good  Insight:  Good  Psychomotor Activity:  Normal  Concentration:  Concentration: Good and Attention Span: Good  Recall:  Good  Fund of Knowledge:Good  Language: Good  Akathisia:  No  Handed:  Right  AIMS (if indicated): Not done  Assets:  Communication Skills Desire for Improvement Leisure  Time Physical Health Social Support  ADL's:  Intact  Cognition: WNL  Sleep:  Poor   Screenings: GAD-7   Flowsheet Row [facility] Visit from [facility]/05/20[facility] in [facility]  Total GAD-7 Score 21    PHQ2-9   Flowsheet Row Office Visit from 04/05/2021 in [facility]  PHQ-2 Total Score 6  PHQ-9  Total Score 26    Flowsheet Row [facility] 04/02/2021 in [facility]  C-SSRS RISK CATEGORY Error: Q7 should not be populated when Q6 is No Low Risk      Assessment and Plan: Patient endorses symptoms of anxiety, depression, and hypomania.  He notes that he has been off of Abilify for 6 months and does not want to restart it because he dislikes it.  He also notes that he dislikes Seroquel.  Today he is agreeable to starting Caplyta 42 mg to help manage hypomania.  He will also restart Zoloft 50 mg to help manage anxiety and depression.  Patient will take trazodone 50 mg as needed to help manage sleep.  1. Generalized anxiety disorder  Start- traZODone (DESYREL) 50 MG tablet; Take 1 tablet (50 mg total) by mouth at bedtime as needed for sleep.  Dispense: 30 tablet; Refill: 2  Restart- sertraline (ZOLOFT) 50 MG tablet; Take 1 tablet (50 mg total) by mouth daily.  Dispense: 30 tablet; Refill: 2  2. Bipolar 1 disorder, mixed, moderate (HCC)  Start- Lumateperone Tosylate (CAPLYTA) 42 MG CAPS; Take 42 mg by mouth daily.  Dispense: 30 capsule; Refill: 2 Start- traZODone (DESYREL) 50 MG tablet; Take 1 tablet (50 mg total) by mouth at bedtime as needed for sleep.  Dispense: 30 tablet; Refill: 2 Restart- sertraline (ZOLOFT) 50 MG tablet; Take 1 tablet (50 mg total) by mouth daily.  Dispense: 30 tablet; Refill: 2  Follow up in 3 months   Shanna CiscoBrittney E Aleila Syverson, NP 5/10/20229:27 AM

## 2021-04-07 ENCOUNTER — Emergency Department (HOSPITAL_COMMUNITY)
Admission: EM | Admit: 2021-04-07 | Discharge: 2021-04-07 | Disposition: A | Discharge status: Home / Self Care | Payer: Medicaid Other | Attending: Emergency Medicine | Admitting: Emergency Medicine

## 2021-04-07 ENCOUNTER — Other Ambulatory Visit: Payer: Self-pay

## 2021-04-07 ENCOUNTER — Encounter (HOSPITAL_COMMUNITY): Payer: Self-pay

## 2021-04-07 DIAGNOSIS — E119 Type 2 diabetes mellitus without complications: Secondary | ICD-10-CM | POA: Insufficient documentation

## 2021-04-07 DIAGNOSIS — F419 Anxiety disorder, unspecified: Secondary | ICD-10-CM | POA: Insufficient documentation

## 2021-04-07 DIAGNOSIS — I10 Essential (primary) hypertension: Secondary | ICD-10-CM | POA: Insufficient documentation

## 2021-04-07 DIAGNOSIS — R002 Palpitations: Secondary | ICD-10-CM | POA: Insufficient documentation

## 2021-04-07 DIAGNOSIS — F17290 Nicotine dependence, other tobacco product, uncomplicated: Secondary | ICD-10-CM | POA: Insufficient documentation

## 2021-04-07 DIAGNOSIS — R45851 Suicidal ideations: Secondary | ICD-10-CM | POA: Insufficient documentation

## 2021-04-07 DIAGNOSIS — U071 COVID-19: Secondary | ICD-10-CM | POA: Insufficient documentation

## 2021-04-07 DIAGNOSIS — Z794 Long term (current) use of insulin: Secondary | ICD-10-CM | POA: Insufficient documentation

## 2021-04-07 DIAGNOSIS — R440 Auditory hallucinations: Secondary | ICD-10-CM | POA: Insufficient documentation

## 2021-04-07 LAB — COMPREHENSIVE METABOLIC PANEL
ALT: 12 U/L (ref 0–44)
AST: 21 U/L (ref 15–41)
Albumin: 4 g/dL (ref 3.5–5.0)
Alkaline Phosphatase: 73 U/L (ref 38–126)
Anion gap: 17 — ABNORMAL HIGH (ref 5–15)
BUN: 20 mg/dL (ref 6–20)
CO2: 24 mmol/L (ref 22–32)
Calcium: 9.3 mg/dL (ref 8.9–10.3)
Chloride: 93 mmol/L — ABNORMAL LOW (ref 98–111)
Creatinine, Ser: 1.15 mg/dL (ref 0.61–1.24)
GFR, Estimated: >60 mL/min (ref >60)
Glucose, Bld: 152 mg/dL — ABNORMAL HIGH (ref 70–99)
Potassium: 3.9 mmol/L (ref 3.5–5.1)
Sodium: 134 mmol/L — ABNORMAL LOW (ref 135–145)
Total Bilirubin: 0.8 mg/dL (ref 0.3–1.2)
Total Protein: 9.5 g/dL — ABNORMAL HIGH (ref 6.5–8.1)

## 2021-04-07 LAB — CBC WITH DIFFERENTIAL/PLATELET
Abs Immature Granulocytes: 0.03 10*3/uL (ref 0.00–0.07)
Basophils Absolute: 0 10*3/uL (ref 0.0–0.1)
Basophils Relative: 0 %
Eosinophils Absolute: 0 10*3/uL (ref 0.0–0.5)
Eosinophils Relative: 0 %
HCT: 44.3 % (ref 39.0–52.0)
Hemoglobin: 14.8 g/dL (ref 13.0–17.0)
Immature Granulocytes: 1 %
Lymphocytes Relative: 18 %
Lymphs Abs: 1.1 10*3/uL (ref 0.7–4.0)
MCH: 28.8 pg (ref 26.0–34.0)
MCHC: 33.4 g/dL (ref 30.0–36.0)
MCV: 86.4 fL (ref 80.0–100.0)
Monocytes Absolute: 0.7 10*3/uL (ref 0.1–1.0)
Monocytes Relative: 12 %
Neutro Abs: 4.2 10*3/uL (ref 1.7–7.7)
Neutrophils Relative %: 69 %
Platelets: 385 10*3/uL (ref 150–400)
RBC: 5.13 MIL/uL (ref 4.22–5.81)
RDW: 13.5 % (ref 11.5–15.5)
WBC: 6.1 10*3/uL (ref 4.0–10.5)
nRBC: 0 % (ref 0.0–0.2)

## 2021-04-07 LAB — RESP PANEL BY RT-PCR (FLU A&B, COVID) ARPGX2
Influenza A by PCR: NEGATIVE
Influenza B by PCR: NEGATIVE
SARS Coronavirus 2 by RT PCR: POSITIVE — AB

## 2021-04-07 LAB — ETHANOL: Alcohol, Ethyl (B): <10 mg/dL (ref <10)

## 2021-04-07 LAB — CBG MONITORING, ED: Glucose-Capillary: 129 mg/dL — ABNORMAL HIGH (ref 70–99)

## 2021-04-07 MED ORDER — SODIUM CHLORIDE 0.9 % IV BOLUS
1000.0000 mL | Freq: Once | INTRAVENOUS | Status: AC
  Administered 2021-04-07: 1000 mL via INTRAVENOUS

## 2021-04-07 MED ORDER — ACETAMINOPHEN 325 MG PO TABS
650.0000 mg | ORAL_TABLET | Freq: Once | ORAL | Status: AC
  Administered 2021-04-07: 650 mg via ORAL
  Filled 2021-04-07: qty 2

## 2021-04-07 NOTE — ED Notes (Signed)
Provider notified and aware of pt's HR of 100.61F. Provider to order 650mg  Tylenol at this time.

## 2021-04-07 NOTE — Discharge Instructions (Addendum)
Make sure you are getting plenty of rest and drink a lot of fluids.  Use Tylenol every 4 hours for fever.  Robitussin-DM for cough.  Continue taking the medicines prescribed by the psychiatrist.  Return here if needed for problems.

## 2021-04-07 NOTE — ED Triage Notes (Signed)
Pt states since last night been having auditory hallucinations telling him to hurt his self with is medication. patient states chest tightness with numbness to left arm.

## 2021-04-07 NOTE — ED Provider Notes (Signed)
[facility]-EMERGENCY DEPT Provider Note   CSN: [id number] Arrival date & time: 04/07/21  1815     History Chief Complaint  Patient presents with  . Psychiatric Evaluation    Jonathan Horne is a 60 y.o. male.  HPI Patient presents for evaluation of rapid heartbeat, ongoing anxiety and audio hallucinations.  He feels worse since starting a new medicine, 2 days ago.  He is taking the medicine for 2 days but did not take them today.  He was evaluated by a psychiatrist, 2 days ago and prescribed new medications.  Prior to that he had been off his medicines for several months.  He is going through a stressful time now because of separation from his wife and is currently living at home hotel.  He came here by [facility].  He has had some thoughts of suicide but states he does not have a plan to kill himself.  He has chronic ongoing difficulty with his left arm and leg which makes it difficult to walk.  He uses a cane to help himself balance.  He denies prior cardiac or pulmonary disorders.  He denies fever, chills, cough, shortness of breath, focal weakness or dizziness.  There are no other known modifying factors    Past Medical History:  Diagnosis Date  . ADHD   . Bipolar 1 disorder (HCC)   . Chronic anemia   . Diabetes mellitus (HCC)   . Encephalopathy due to infection 09/2018  . History of alcohol abuse   . HTN (hypertension)   . Insomnia   . Meningitis due to bacteria   . Rash    chronic  . Ulnar neuropathy     Patient Active Problem List   Diagnosis Date Noted  . Generalized anxiety disorder 04/05/2021  . Bipolar 1 disorder, mixed, moderate (HCC) 04/05/2021  . Visual hallucinations 04/04/2021  . 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 Cervical myelopathy (HCC) 10/23/2018  . Constipation   . Neurogenic bladder   . Epidural abscess   . Neurogenic bowel   . Bipolar disorder (HCC)   . Acute blood loss anemia   . Diabetes  mellitus type 2 in nonobese [facility](HCC)     Past Surgical History:  Procedure Laterality Date  . CERVICAL LAMINECTOMY  10/11/2018   C1- C3 posterior laminectomy with decompression of spinal cord  . CYST EXCISION     from left hip.        Family History  Problem Relation Age of Onset  . Coronary artery disease Father   . Diabetes Father   . Alcohol abuse Brother   . Asperger's syndrome Son   . Asperger's syndrome Daughter     Social History   Tobacco Use  . Smoking status: Current Some Day Smoker    Types: Cigars  . Smokeless tobacco: Never Used  Vaping Use  . Vaping Use: Never used  Substance Use Topics  . Alcohol use: Yes    Comment: denies regular etoh use  . Drug use: Yes    Comment: states uses delta 8 for pain    Home Medications Prior to Admission medications   Medication Sig Start Date End Date Taking? Authorizing Provider  atorvastatin (LIPITOR) 80 MG tablet Take 80 mg by mouth daily. 09/27/18   [provider]  bisacodyl (DULCOLAX) 10 MG suppository Place 1 suppository (10 mg total) rectally daily at 6 (six) AM. 11/07/18   Love, Evlyn KannerPamela S, PA-C  insulin aspart (NOVOLOG) cartridge Inject 8-10  Units into the skin 3 (three) times daily with meals. Take 8 units with breakfast. Take 10 units with lunch and supper. 11/06/18   Love, Evlyn Kanner, PA-C  Insulin Glargine (LANTUS) 100 UNIT/ML Solostar Pen Inject 28 Units into the skin daily. 11/06/18   Love, Evlyn Kanner, PA-C  Insulin Pen Needle (CAREFINE PEN NEEDLES) 31G X 6 MM MISC 1 application by Does not apply route 3 (three) times daily with meals. 11/06/18   Love, Evlyn Kanner, PA-C  lidocaine (XYLOCAINE) 2 % jelly Apply topically as needed (Use with in and out catheter). 11/06/18   Love, Evlyn Kanner, PA-C  Lumateperone Tosylate (CAPLYTA) 42 MG CAPS Take 42 mg by mouth daily. 04/05/21   Shanna Cisco, NP  methocarbamol (ROBAXIN) 750 MG tablet Take 1 tablet (750 mg total) by mouth every 6 (six) hours as needed for muscle  spasms (neck/shoulder pain). 11/06/18   Love, Evlyn Kanner, PA-C  nystatin cream (MYCOSTATIN) Apply topically 2 (two) times daily. 11/06/18   Love, Evlyn Kanner, PA-C  protein supplement (RESOURCE BENEPROTEIN) POWD Take 6 g by mouth 3 (three) times daily with meals. 11/06/18   Love, Evlyn Kanner, PA-C  senna-docusate (SENOKOT-S) 8.6-50 MG tablet Take 2 tablets by mouth at bedtime. 11/06/18   Love, Evlyn Kanner, PA-C  sertraline (ZOLOFT) 50 MG tablet Take 1 tablet (50 mg total) by mouth daily. 04/05/21   Shanna Cisco, NP  sodium phosphate (FLEET) 7-19 GM/118ML ENEM Place 133 mLs (1 enema total) rectally daily as needed for severe constipation. 11/06/18   Love, Evlyn Kanner, PA-C  tamsulosin (FLOMAX) 0.4 MG CAPS capsule Take 1 capsule (0.4 mg total) by mouth daily. 11/06/18   Love, Evlyn Kanner, PA-C  traZODone (DESYREL) 50 MG tablet Take 1 tablet (50 mg total) by mouth at bedtime as needed for sleep. 04/05/21   Shanna Cisco, NP    Allergies    Lisinopril, Reglan [metoclopramide], and Tramadol  Review of Systems   Review of Systems  All other systems reviewed and are negative.   Physical Exam Updated Vital Signs BP (!) 162/94 (BP Location: Right Arm)   Pulse (!) 133   Temp 100.2 F (37.9 C) (Oral)   Resp (!) 22   Ht 5\' 10"  (1.778 m)   Wt 86.2 kg   SpO2 100%   BMI 27.26 kg/m   Physical Exam Vitals and nursing note reviewed.  Constitutional:      General: He is not in acute distress.    Appearance: He is well-developed. He is not ill-appearing, toxic-appearing or diaphoretic.  HENT:     Head: Normocephalic and atraumatic.     Right Ear: External ear normal.     Left Ear: External ear normal.  Eyes:     Conjunctiva/sclera: Conjunctivae normal.     Pupils: Pupils are equal, round, and reactive to light.  Neck:     Trachea: Phonation normal.  Cardiovascular:     Rate and Rhythm: Normal rate and regular rhythm.     Heart sounds: Normal heart sounds.  Pulmonary:     Effort: Pulmonary  effort is normal.     Breath sounds: Normal breath sounds.  Abdominal:     General: There is no distension.     Palpations: Abdomen is soft.     Tenderness: There is no abdominal tenderness.  Musculoskeletal:        General: Normal range of motion.     Cervical back: Normal range of motion and neck supple.  Skin:  General: Skin is warm and dry.  Neurological:     Mental Status: He is alert and oriented to person, place, and time.     Cranial Nerves: No cranial nerve deficit.     Sensory: No sensory deficit.     Motor: No abnormal muscle tone.     Coordination: Coordination normal.  Psychiatric:        Mood and Affect: Mood normal.        Behavior: Behavior normal.        Thought Content: Thought content normal.        Judgment: Judgment normal.     ED Results / Procedures / Treatments   Labs (all labs ordered are listed, but only abnormal results are displayed) Labs Reviewed - No data to display  EKG None  Radiology No results found.  Procedures Procedures   Medications Ordered in ED Medications - No data to display  ED Course  I have reviewed the triage vital signs and the nursing notes.  Pertinent labs & imaging results that were available during my care of the patient were reviewed by me and considered in my medical decision making (see chart for details).    MDM Rules/Calculators/A&P                           Patient Vitals for the past 24 hrs:  BP Temp Temp src Pulse Resp SpO2 Height Weight  04/07/21 1831 (!) 162/94 100.2 F (37.9 C) Oral (!) 133 (!) 22 100 % -- --  04/07/21 1825 -- -- -- -- -- -- 5\' 10"  (1.778 m) 86.2 kg    9:57 PM Reevaluation with update and discussion. After initial assessment and treatment, an updated evaluation reveals he is comfortable now and has no further complaints.  Findings discussed and questions answered.   Medical Decision Making:  This patient is presenting for evaluation of palpitations, which does  require a range of treatment options, and is a complaint that involves a moderate risk of morbidity and mortality. The differential diagnoses include cardiac disorder, acute illness, metabolic disorder, viral illness. I decided to review old records, and in summary Ehly male presenting with nonspecific symptoms, following recent initiation of treatment for anxiety and psychosis..  I did not require additional historical information from anyone.  Clinical Laboratory Tests Ordered, included CBC, Metabolic panel and Alcohol level, viral panel, UDS. Review indicates COVID-positive, flu negative, sodium low, chloride low, glucose high, total protein high.  Critical Interventions-clinical evaluation, fluids, Tylenol, observation reassessment  After These Interventions, the Patient was reevaluated and was found with elevated heart rate likely secondary to acute illness, COVID infection.  Doubt significant medication compromise.  Patient reports suicidal ideation without plan, but otherwise feels currently stable.  Doubt unstable psychiatric condition or active suicidal ideation.  CRITICAL CARE-no Performed by: Mancel Bale  Nursing Notes Reviewed/ Care Coordinated Applicable Imaging Reviewed Interpretation of Laboratory Data incorporated into ED treatment  The patient appears reasonably screened and/or stabilized for discharge and I doubt any other medical condition or other [facility] requiring further screening, evaluation, or treatment in the ED at this time prior to discharge.  Plan: Home Medications-continue usual, and use Tylenol for fever, Robitussin-DM for cough; Home Treatments-rest, fluid, isolate and mask; return here if the recommended treatment, does not improve the symptoms; Recommended follow up-PCP, as needed.  Psychiatry as planned.  COVID follow-up clinic for further assessment and treatment within 1 to 2 days.  Final Clinical Impression(s) / ED Diagnoses Final diagnoses:  COVID-19  virus infection  Anxiety    Rx / DC Orders ED Discharge Orders    None       Mancel Bale, MD 04/07/21 2159

## 2021-04-08 ENCOUNTER — Encounter: Payer: Self-pay | Admitting: Nurse Practitioner

## 2021-04-08 ENCOUNTER — Telehealth: Payer: Self-pay

## 2021-04-08 DIAGNOSIS — E119 Type 2 diabetes mellitus without complications: Secondary | ICD-10-CM | POA: Insufficient documentation

## 2021-04-08 DIAGNOSIS — I10 Essential (primary) hypertension: Secondary | ICD-10-CM | POA: Insufficient documentation

## 2021-04-08 NOTE — Telephone Encounter (Signed)
Called patient to schedule ED follow up per discharge instructions on 04/07/2021. Patient states he does not wish to make appt at [facility] at this time, but has the phone number to do so in the future if necessary.

## 2021-04-12 ENCOUNTER — Telehealth (HOSPITAL_COMMUNITY): Payer: Self-pay | Admitting: Psychiatry

## 2021-04-12 ENCOUNTER — Other Ambulatory Visit: Payer: Self-pay

## 2021-04-12 NOTE — Telephone Encounter (Signed)
Patient requesting a referral for disability. Patient states is required to begin process. Explained only seen once by provider. Pt states no PCP. Pt states he can get records from previous provider as supporting documentation if needed.

## 2021-04-15 NOTE — Telephone Encounter (Signed)
Provider spoke to patient on 04/12/2020 and was informed that he is in need of a referral to get housing through the [facility]. Provider asked patient where a referral needed to be sent and he was unsure. He notes he would call provider back but never did. Provider called patient this morning and informed him that writer spoke to individuals at the [facility] and was told that a referral was not needed. Patient was given the number to the [facility] [phone]. He was also given the number to [place] housing coalition, [employer], and [employer] to end homelessness. Patient was grateful for resources and notes that he is also receiving assistance by another health care worker. No other concerns noted at this time.

## 2021-07-06 ENCOUNTER — Telehealth (HOSPITAL_COMMUNITY): Payer: No Payment, Other | Admitting: Psychiatry
